# Patient Record
Sex: Female | Born: 1950 | Race: White | Hispanic: No | Smoking: Never smoker
Health system: Southern US, Community
[De-identification: ages and names within clinical notes are randomized; demographics above are authoritative.]

## PROBLEM LIST (undated history)

## (undated) DIAGNOSIS — F329 Major depressive disorder, single episode, unspecified: Secondary | ICD-10-CM

## (undated) DIAGNOSIS — F32A Depression, unspecified: Secondary | ICD-10-CM

## (undated) DIAGNOSIS — E559 Vitamin D deficiency, unspecified: Secondary | ICD-10-CM

## (undated) DIAGNOSIS — G2581 Restless legs syndrome: Secondary | ICD-10-CM

## (undated) DIAGNOSIS — F419 Anxiety disorder, unspecified: Secondary | ICD-10-CM

## (undated) DIAGNOSIS — I10 Essential (primary) hypertension: Secondary | ICD-10-CM

## (undated) DIAGNOSIS — E785 Hyperlipidemia, unspecified: Secondary | ICD-10-CM

## (undated) HISTORY — PX: CHOLECYSTECTOMY: SHX55

## (undated) HISTORY — DX: Restless legs syndrome: G25.81

## (undated) HISTORY — DX: Essential (primary) hypertension: I10

## (undated) HISTORY — DX: Major depressive disorder, single episode, unspecified: F32.9

## (undated) HISTORY — DX: Anxiety disorder, unspecified: F41.9

## (undated) HISTORY — PX: APPENDECTOMY: SHX54

## (undated) HISTORY — DX: Hyperlipidemia, unspecified: E78.5

## (undated) HISTORY — PX: ABDOMINAL HYSTERECTOMY: SHX81

## (undated) HISTORY — DX: Vitamin D deficiency, unspecified: E55.9

## (undated) HISTORY — DX: Depression, unspecified: F32.A

## (undated) HISTORY — PX: ADENOIDECTOMY: SUR15

## (undated) HISTORY — PX: ANKLE SURGERY: SHX546

---

## 1998-11-29 ENCOUNTER — Encounter: Payer: Self-pay | Admitting: Emergency Medicine

## 1998-11-29 ENCOUNTER — Emergency Department (HOSPITAL_COMMUNITY): Admission: EM | Admit: 1998-11-29 | Discharge: 1998-11-29 | Payer: Self-pay | Admitting: Emergency Medicine

## 2001-11-17 ENCOUNTER — Encounter: Admission: RE | Admit: 2001-11-17 | Discharge: 2001-11-17 | Payer: Self-pay | Admitting: Urology

## 2001-11-17 ENCOUNTER — Encounter: Payer: Self-pay | Admitting: Urology

## 2003-12-18 ENCOUNTER — Ambulatory Visit (HOSPITAL_COMMUNITY): Admission: RE | Admit: 2003-12-18 | Discharge: 2003-12-18 | Payer: Self-pay | Admitting: Gastroenterology

## 2010-01-08 ENCOUNTER — Other Ambulatory Visit: Admission: RE | Admit: 2010-01-08 | Discharge: 2010-01-08 | Payer: Self-pay | Admitting: Family Medicine

## 2010-01-17 ENCOUNTER — Other Ambulatory Visit: Admission: RE | Admit: 2010-01-17 | Discharge: 2010-01-17 | Payer: Self-pay | Admitting: Family Medicine

## 2011-01-16 ENCOUNTER — Other Ambulatory Visit: Payer: Self-pay | Admitting: Family Medicine

## 2011-01-16 ENCOUNTER — Other Ambulatory Visit (HOSPITAL_COMMUNITY)
Admission: RE | Admit: 2011-01-16 | Discharge: 2011-01-16 | Disposition: A | Discharge status: Home / Self Care | Payer: BC Managed Care – PPO | Source: Ambulatory Visit | Attending: Family Medicine | Admitting: Family Medicine

## 2011-01-16 DIAGNOSIS — Z01419 Encounter for gynecological examination (general) (routine) without abnormal findings: Secondary | ICD-10-CM | POA: Insufficient documentation

## 2011-04-18 NOTE — Op Note (Signed)
[name], Margaret Frank                         ACCOUNT NO.:  [id number]   MEDICAL RECORD NO.:  [id number]                   PATIENT TYPE:  AMB   LOCATION:                                       FACILITY:  [facility]   PHYSICIAN:  John C. Madilyn Fireman, M.D.                 DATE OF BIRTH:  [birth year]   DATE OF PROCEDURE:  12/18/2003  DATE OF DISCHARGE:                                 OPERATIVE REPORT   PROCEDURE:  Colonoscopy.   INDICATION FOR PROCEDURE:  Average-risk colon cancer screening in a 60-year-  old patient with no prior screening.   DESCRIPTION OF PROCEDURE:  The patient was placed in the left lateral  decubitus position and placed on the pulse monitor with continuous low-flow  oxygen and delivered by nasal cannula.  She was sedated with 100 mcg IV  fentanyl and 10 mg IV Versed.  The Olympus video colonoscope was inserted  into the rectum and advanced its entire length.  However, there was a lot of  looping which I could not straighten and despite multiple position changes,  abdominal pressure, and torquing maneuvers, I was unable to reach the cecum.  It was not clear what the point of the most proximal advancement was, but it  was felt to be probably somewhere in the transverse colon.  The prep was  fairly good in the areas examined.  The mucosa of the visualized transverse  as well as descending, sigmoid, and rectum appeared normal with no masses,  polyps, diverticula, or other mucosal abnormalities.  The scope was then  withdrawn and the patient returned to the recovery room in stable condition.  She tolerated the procedure well, and there were no immediate complications.   IMPRESSION:  Normal incomplete colonoscopy estimated to the proximal  transverse colon.   PLAN:  Will follow up with barium enema.                                               John C. Madilyn Fireman, M.D.    JCH/MEDQ  D:  12/18/2003  T:  12/18/2003  Job:  [id number]   cc:   Vikki Ports, M.D.  [address] [place]  [place]  [place] [address]  Fax: [phone]

## 2015-04-04 ENCOUNTER — Other Ambulatory Visit (HOSPITAL_COMMUNITY): Payer: Self-pay | Admitting: Dermatology

## 2015-10-17 DIAGNOSIS — F329 Major depressive disorder, single episode, unspecified: Secondary | ICD-10-CM | POA: Insufficient documentation

## 2015-10-17 DIAGNOSIS — F419 Anxiety disorder, unspecified: Secondary | ICD-10-CM | POA: Insufficient documentation

## 2015-10-17 DIAGNOSIS — F32A Depression, unspecified: Secondary | ICD-10-CM | POA: Insufficient documentation

## 2015-12-11 ENCOUNTER — Ambulatory Visit: Payer: BC Managed Care – PPO | Admitting: Cardiovascular Disease

## 2015-12-31 DIAGNOSIS — E785 Hyperlipidemia, unspecified: Secondary | ICD-10-CM | POA: Insufficient documentation

## 2015-12-31 DIAGNOSIS — F411 Generalized anxiety disorder: Secondary | ICD-10-CM | POA: Insufficient documentation

## 2015-12-31 DIAGNOSIS — I10 Essential (primary) hypertension: Secondary | ICD-10-CM | POA: Insufficient documentation

## 2016-01-10 ENCOUNTER — Ambulatory Visit: Payer: BC Managed Care – PPO | Admitting: Cardiovascular Disease

## 2016-02-05 NOTE — Progress Notes (Signed)
Patient ID: Margaret Frank, female   DOB: 27-Aug-1951, 65 y.o.   MRN: [id number]     Cardiology Office Note   Date:  02/11/2016   ID:  Margaret Frank, DOB 27-Aug-1951, MRN [id number]  PCP:  Margaret HeronVictoria R Rankins, MD  Cardiologist:   Margaret HawsPeter Perris Tripathi, MD   Chief Complaint  Patient presents with  . Establish Care    no sx  . Hyperlipidemia      History of Present Illness: Margaret Frank is a 65 y.o. 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   Has tried multiple statins and zetia over the last 20 years and unable to tolerate any Even Red Yeast Rice causes muscle aches  Note from Dr Cameron Sprangankins Margaret Frank that LDL was 172  She has no vascular disease She is on estrogen.  CRF HTN on Rx In our office was having asymptomatic PVC;s  Still struggles some with depression. Has two children son lives in [place] and is divorced , teaching and raising two young Children on his own.  Daughter lives in [place] and teaches dance but has RA and an autistic child.  She is not very active but denies, chest pain, dyspnea palpitations or syncope compliant with meds No excess ETOH    Past Medical History  Diagnosis Date  . Hyperlipidemia   . Hypertension   . Depression     Past Surgical History  Procedure Laterality Date  . Ankle surgery Right   . Abdominal hysterectomy    . Appendectomy    . Cholecystectomy    . Adenoidectomy       Current Outpatient Prescriptions  Medication Sig Dispense Refill  . ascorbic acid (VITAMIN C) 500 MG/5ML syrup Take 5 mLs by mouth daily.    . Cholecalciferol (VITAMIN D) 2000 units CAPS Take 1 capsule by mouth daily.    Marland Kitchen. estrogens, conjugated, (PREMARIN) 0.3 MG tablet Take 1 tablet by mouth daily.     . hydrochlorothiazide (HYDRODIURIL) 25 MG tablet Take 25 mg by mouth daily.    . Magnesium 250 MG TABS Take 1 tablet by mouth daily.    .  Omega-3 Fatty Acids (FISH OIL PO) Take 2,000 mg by mouth daily.    Marland Kitchen. PARoxetine (PAXIL) 40 MG tablet Take 1 tablet by mouth daily.    . Potassium Chloride ER 20 MEQ TBCR Take 1 tablet by mouth daily.    . Rhodiola 300 MG CAPS Take 1 capsule by mouth daily.    . vitamin B-12 (CYANOCOBALAMIN) 1000 MCG tablet Take 1 tablet by mouth daily.    Marland Kitchen. zolpidem (AMBIEN) 10 MG tablet Take 10 mg by mouth at bedtime as needed. for sleep  0   No current facility-administered medications for this visit.    Allergies:   Atorvastatin    Social History:  The patient  reports that she has never smoked. She does not have any smokeless tobacco history on file.   Family History:  The patient's family history includes Dementia in her father; Hypertension in her father; Pancreatic cancer in her mother.    ROS:  Please see the history of present illness.   Otherwise, review of systems are positive for none.   All other systems are reviewed and negative.    PHYSICAL EXAM: VS:  BP 140/78 mmHg  Pulse 75  Ht 5\' 9"  (1.753 m)  Wt  87 kg (191 lb 12.8 oz)  BMI 28.31 kg/m2  SpO2 92% , BMI Body mass index is 28.31 kg/(m^2). Affect appropriate Healthy:  appears stated age HEENT: normal Neck supple with no adenopathy JVP normal no bruits no thyromegaly Lungs clear with no wheezing and good diaphragmatic motion Heart:  S1/S2 no murmur, no rub, gallop or click PMI normal Abdomen: benighn, BS positve, no tenderness, no AAA no bruit.  No HSM or HJR Distal pulses intact with no bruits No edema Neuro non-focal Skin warm and dry No muscular weakness    EKG:  02/11/16   NSR PVC otherwise normal  Previous Margaret Frank office 10/17/15  No PVC;s    Recent Labs: No results found for requested labs within last 365 days.    Lipid Panel No results found for: CHOL, TRIG, HDL, CHOLHDL, VLDL, LDLCALC, LDLDIRECT    Wt Readings from Last 3 Encounters:  02/11/16 87 kg (191 lb 12.8 oz)      Other studies  Reviewed: Additional studies/ records that were reviewed today include: Margaret Frank office note, labs and ECG see above .    ASSESSMENT AND PLAN:  1.  Chol:  LDL 172 no vascular disease and intolerant to multiple drugs.  Will order calcium score and carotid duplex to r/o intimal thickening and CAD.  Refer to lipid Clinic for possible non PSK9 research trial.  Would not be a candidate for PSK9 without vascular disease 2. HTN:  Well controlled.  Continue current medications and low sodium Dash type diet.   3. PVC;s asymptomatic  Follow K/Mg periodically on diuretic 4. Depression :  Continue Paxil   Current medicines are reviewed at length with the patient today.  The patient does not have concerns regarding medicines.  The following changes have been made:  no change  Labs/ tests ordered today include:  Calcium score carotid US and refer to lipid clinic   Orders Placed This Encounter  Procedures  . CT Cardiac Scoring  . EKG 12-Lead     Disposition:   FU with lipid clinic      Signed, Margaret Haws, MD  02/11/2016 5:04 PM    [facility] [address] [place], [place], [place]  [address] Phone: [phone]; Fax: [phone]

## 2016-02-08 ENCOUNTER — Encounter: Payer: Self-pay | Admitting: Cardiovascular Disease

## 2016-02-11 ENCOUNTER — Ambulatory Visit (INDEPENDENT_AMBULATORY_CARE_PROVIDER_SITE_OTHER): Payer: Medicare Other | Admitting: Cardiovascular Disease

## 2016-02-11 ENCOUNTER — Encounter: Payer: Self-pay | Admitting: Cardiovascular Disease

## 2016-02-11 VITALS — BP 140/78 | HR 75 | Ht 69.0 in | Wt 191.8 lb

## 2016-02-11 DIAGNOSIS — Z7689 Persons encountering health services in other specified circumstances: Secondary | ICD-10-CM

## 2016-02-11 DIAGNOSIS — R0989 Other specified symptoms and signs involving the circulatory and respiratory systems: Secondary | ICD-10-CM

## 2016-02-11 DIAGNOSIS — E785 Hyperlipidemia, unspecified: Secondary | ICD-10-CM | POA: Diagnosis not present

## 2016-02-11 DIAGNOSIS — Z7189 Other specified counseling: Secondary | ICD-10-CM

## 2016-02-11 NOTE — Patient Instructions (Signed)
Medication Instructions:  Your physician recommends that you continue on your current medications as directed. Please refer to the Current Medication list given to you today.  Labwork: NONE  Testing/Procedures: Your physician has requested that you have a carotid duplex. This test is an ultrasound of the carotid arteries in your neck. It looks at blood flow through these arteries that supply the brain with blood. Allow one hour for this exam. There are no restrictions or special instructions.  Cardiac CT calcium score, (CAT scanning), is a noninvasive, special x-ray that produces cross-sectional images of the body using x-rays and a computer. CT scans help physicians diagnose and treat medical conditions. For some CT exams, a contrast material is used to enhance visibility in the area of the body being studied. CT scans provide greater clarity and reveal more details than regular x-ray exams.  Follow-Up: Your physician wants you to follow-up in: 6 months with Dr. Eden EmmsNishan. You will receive a reminder letter in the mail two months in advance. If you don't receive a letter, please call our office to schedule the follow-up appointment.  Your physician recommends that you schedule a follow-up appointment with lipid clinic for intolerance to statin medications.    If you need a refill on your cardiac medications before your next appointment, please call your pharmacy.

## 2016-02-18 ENCOUNTER — Other Ambulatory Visit: Payer: Medicare Other

## 2016-02-22 ENCOUNTER — Ambulatory Visit (INDEPENDENT_AMBULATORY_CARE_PROVIDER_SITE_OTHER)
Admission: RE | Admit: 2016-02-22 | Discharge: 2016-02-22 | Disposition: A | Discharge status: Home / Self Care | Payer: Self-pay | Source: Ambulatory Visit | Attending: Cardiovascular Disease | Admitting: Cardiovascular Disease

## 2016-02-22 ENCOUNTER — Ambulatory Visit (INDEPENDENT_AMBULATORY_CARE_PROVIDER_SITE_OTHER): Payer: Medicare Other | Admitting: Pharmacist

## 2016-02-22 ENCOUNTER — Ambulatory Visit (HOSPITAL_COMMUNITY)
Admission: RE | Admit: 2016-02-22 | Discharge: 2016-02-22 | Disposition: A | Discharge status: Home / Self Care | Payer: Medicare Other | Source: Ambulatory Visit | Attending: Cardiovascular Disease | Admitting: Cardiovascular Disease

## 2016-02-22 DIAGNOSIS — E785 Hyperlipidemia, unspecified: Secondary | ICD-10-CM

## 2016-02-22 DIAGNOSIS — I10 Essential (primary) hypertension: Secondary | ICD-10-CM | POA: Diagnosis not present

## 2016-02-22 DIAGNOSIS — F329 Major depressive disorder, single episode, unspecified: Secondary | ICD-10-CM | POA: Diagnosis not present

## 2016-02-22 DIAGNOSIS — I6523 Occlusion and stenosis of bilateral carotid arteries: Secondary | ICD-10-CM | POA: Insufficient documentation

## 2016-02-22 DIAGNOSIS — R0989 Other specified symptoms and signs involving the circulatory and respiratory systems: Secondary | ICD-10-CM | POA: Insufficient documentation

## 2016-02-24 NOTE — Progress Notes (Signed)
Patient ID: Margaret Frank, female   DOB: 1951-05-08, 65 y.o.   MRN: [id number]      Date:  02/22/2016  ID:  Margaret HoraMarsha Mcgruder, DOB 1951-05-08, MRN [id number]  PCP:  Clayborn HeronVictoria R Rankins, MD  Cardiologist:   Charlton HawsPeter Nishan, MD  Chief Complaint  Patient presents with  . Hyperlipidemia      History of Present Illness: Margaret HoraMarsha Mccardle is a 65 y.o. female patient of Dr. Eden EmmsNishan who was referred to the Lipid Clinic for statin intolerance.  She was referred by her PCP who has tried all of the medications with her.  Pt states she has tried 6 different statins, including low dose Crestor at 2.5mg  daily and experienced severe myalgias with each one.  She most recently tried Zetia and stated the myalgias were very severe this this as well. The OV Note from Dr Cameron Sprangankins [place] that LDL was 172.  We do not have a copy of the full panel but the patients reports her TG were better this time and her HDL was ~90 mg/dL.    Pt's PMH is significant only for HTN.  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  She also had a cardiac CT that showed her calcium score was 19 (57% percentile).     RF: HTN, age LDL goal < 130 Intolerances- Lipitor, Crestor, Zocor, Zetia  Current Therapy: none   Past Medical History  Diagnosis Date  . Hyperlipidemia   . Hypertension   . Depression     Past Surgical History  Procedure Laterality Date  . Ankle surgery Right   . Abdominal hysterectomy    . Appendectomy    . Cholecystectomy    . Adenoidectomy       Current Outpatient Prescriptions  Medication Sig Dispense Refill  . ascorbic acid (VITAMIN C) 500 MG/5ML syrup Take 5 mLs by mouth daily.    . Cholecalciferol (VITAMIN D) 2000 units CAPS Take 1 capsule by mouth daily.    Marland Kitchen. estrogens, conjugated, (PREMARIN) 0.3 MG tablet Take 1 tablet by mouth daily.     . hydrochlorothiazide (HYDRODIURIL) 25 MG tablet Take 25 mg by mouth daily.    . Magnesium 250 MG TABS Take 1  tablet by mouth daily.    . Omega-3 Fatty Acids (FISH OIL PO) Take 2,000 mg by mouth daily.    Marland Kitchen. PARoxetine (PAXIL) 40 MG tablet Take 1 tablet by mouth daily.    . Potassium Chloride ER 20 MEQ TBCR Take 1 tablet by mouth daily.    . Rhodiola 300 MG CAPS Take 1 capsule by mouth daily.    . vitamin B-12 (CYANOCOBALAMIN) 1000 MCG tablet Take 1 tablet by mouth daily.    Marland Kitchen. zolpidem (AMBIEN) 10 MG tablet Take 10 mg by mouth at bedtime as needed. for sleep  0   No current facility-administered medications for this visit.    Allergies:   Atorvastatin    Social History:  The patient  reports that she has never smoked. She does not have any smokeless tobacco history on file.   Family History:  The patient's family history includes Dementia in her father; Hypertension in her father; Pancreatic cancer in her mother.    ASSESSMENT AND PLAN:  1.  Hyperlipidemia- Pt's LDL above goal but pt is intolerant to 6 different statins.  Her diagnostic tests today showed mild disease but not enough to classify as CAD to qualify pt for PCSK-9 inhibitor therapy.  Discussed  other oral options with patient and data in primary prevention.  She was not interested in these at the moment.  We discussed potential clinical trials that will be starting soon.  She is interested in pursing these.  Will see if she meets inclusion criteria and will contact patient as soon as enrollment begins.     Edrick Oh [facility]  02/24/2016 2:06 PM    [facility] [address] [place], [place], [place]  [address] Phone: [phone]; Fax: [phone]

## 2016-06-16 ENCOUNTER — Encounter: Payer: Self-pay | Admitting: Cardiovascular Disease

## 2016-08-19 ENCOUNTER — Encounter: Payer: Self-pay | Admitting: Cardiovascular Disease

## 2016-08-29 ENCOUNTER — Ambulatory Visit: Payer: Medicare Other | Admitting: Cardiovascular Disease

## 2016-09-01 ENCOUNTER — Ambulatory Visit: Payer: Medicare Other | Admitting: Cardiovascular Disease

## 2016-12-04 IMAGING — CT CT HEART SCORING
2 series · 16 of 20 positions shown, 18 images · non-contrast
Comparison: none

ADDENDUM:
OVER-READ INTERPRETATION  CT CHEST

The following report is an over-read performed by radiologist Dr.
over-read does not include interpretation of cardiac or coronary
anatomy or pathology. The CTA interpretation by the cardiologist is
attached.
No mediastinal or hilar mass is identified in the visualized
portions. Mild fusiform enlargement of the ascending aorta with
maximum measurement 3.6 cm. The esophagus is grossly normal. Suspect
small hiatal hernia.
The visualized lungs are clear. No worrisome pulmonary lesions or
acute pulmonary findings. No interstitial lung disease or
bronchiectasis.
CLINICAL DATA: Risk stratification
EXAM:
Coronary Calcium Score
MEDICATIONS:
None.
TECHNIQUE: The patient was scanned on a Siemens Somatom 64 slice scanner. Axial
non-contrast 3 mm slices were carried out through the heart. The
data set was analyzed on a dedicated work station and scored using
the Agatson method.

[Series 2: casc 3.0 i36f 2 bestdiast 75 % · axial · 0.32mm/px · z∈[-134,-50]mm · 8 of 37 slices shown, 10 images]
[im 5/37  vessel]
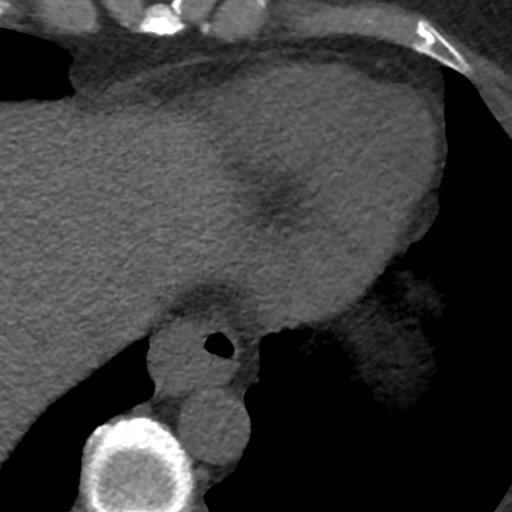
[im 5/37  lung]
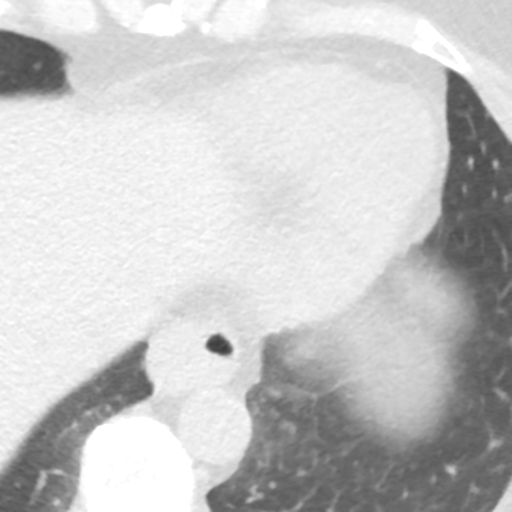
[im 9/37  vessel]
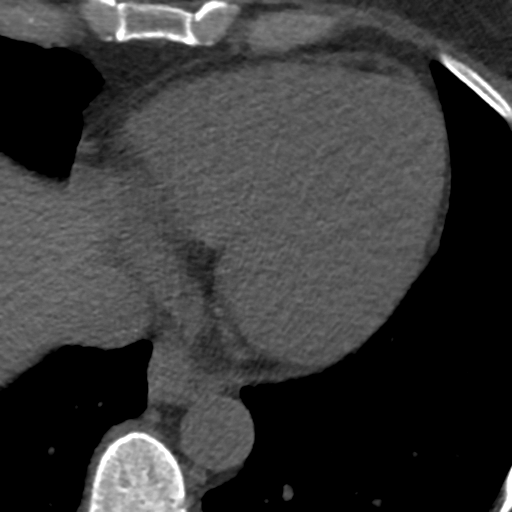
[im 13/37  vessel]
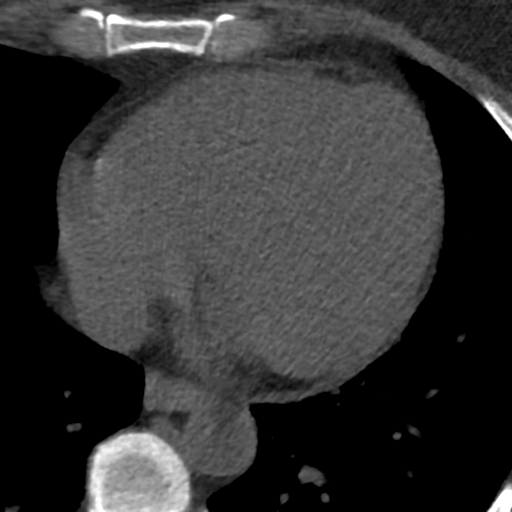
[im 17/37  vessel]
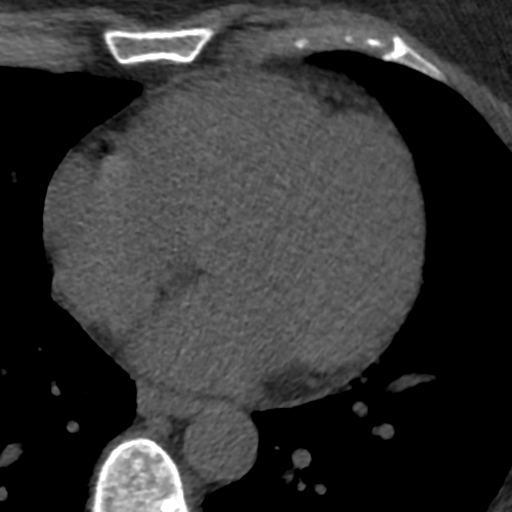
[im 21/37  vessel]
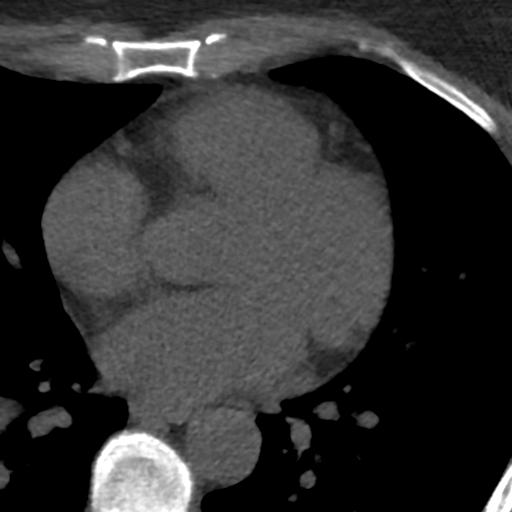
[im 21/37  lung]
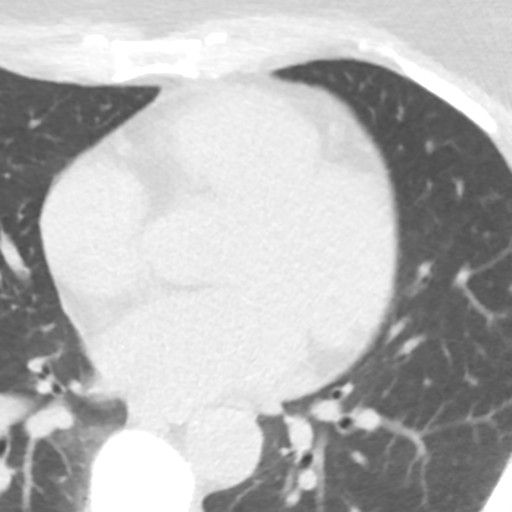
[im 25/37  vessel]
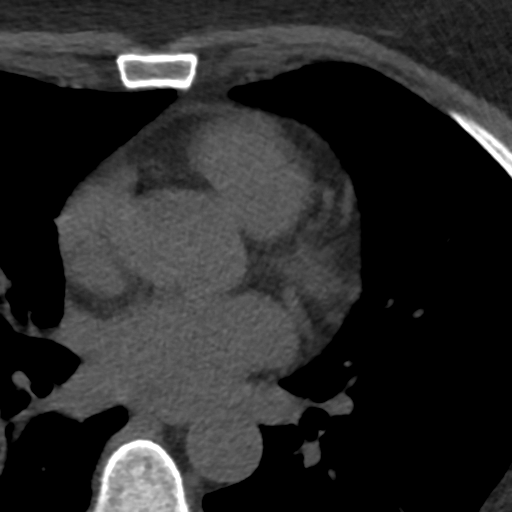
[im 29/37  vessel]
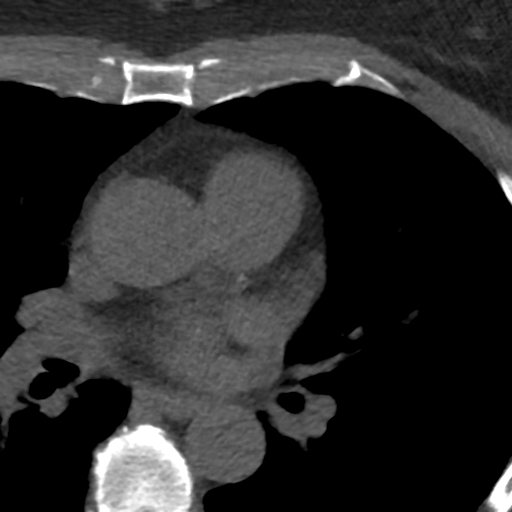
[im 33/37  vessel]
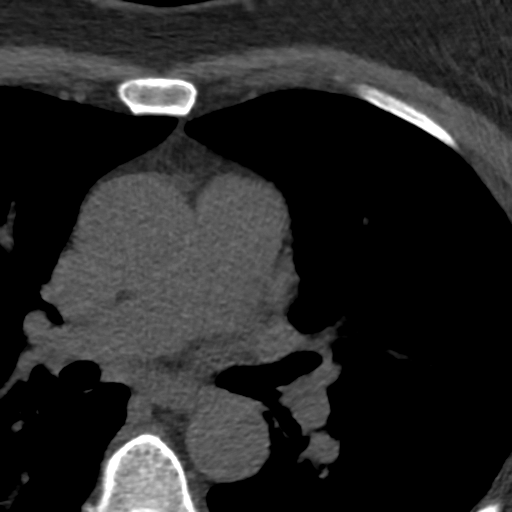

[Series 4: lung st 75 % · axial · 0.62mm/px · z∈[-134,-50]mm · 8 of 37 slices shown]
[im 5/37  lung]
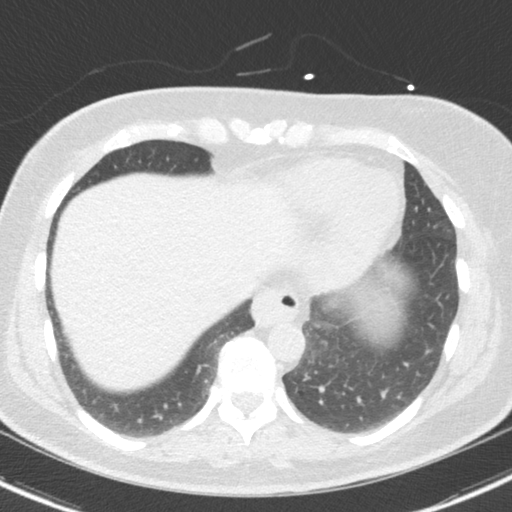
[im 9/37  lung]
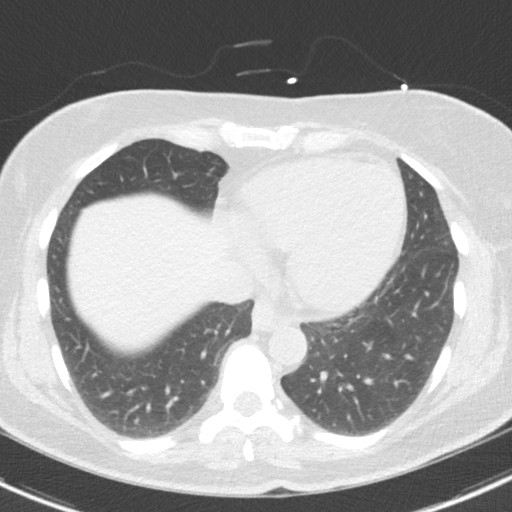
[im 13/37  lung]
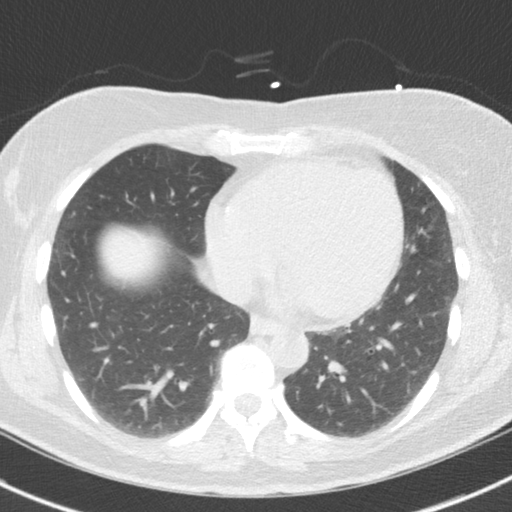
[im 17/37  lung]
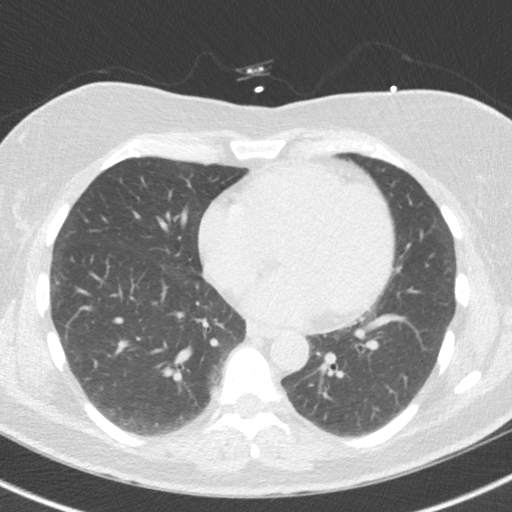
[im 21/37  lung]
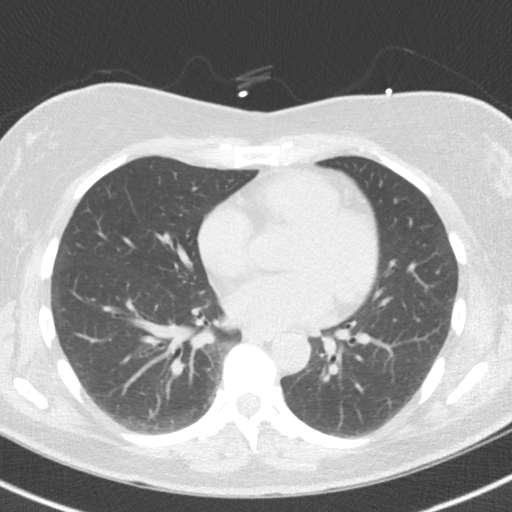
[im 25/37  lung]
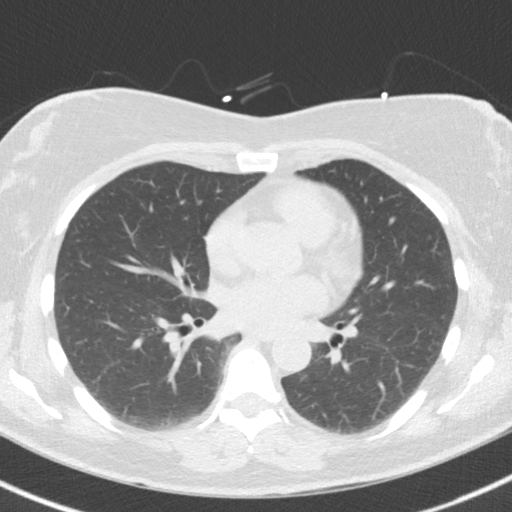
[im 29/37  lung]
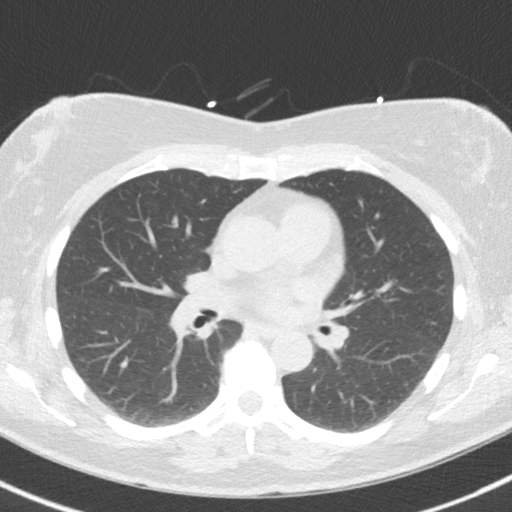
[im 33/37  lung]
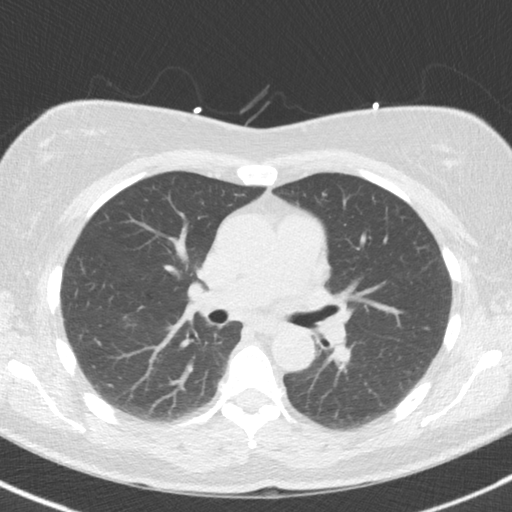

[16 of 20 positions shown; findings below may reference images not displayed]

FINDINGS: Non-cardiac: See separate report from [REDACTED].

Ascending Aorta:  Normal size.

Pericardium: Normal.

Coronary arteries:  Normal origin.
IMPRESSION: Coronary calcium score of 19. This was 57 percentile for age and sex
matched control.

Washifa Hikaka

## 2018-07-13 ENCOUNTER — Telehealth: Payer: Self-pay

## 2018-07-13 NOTE — Telephone Encounter (Signed)
Sent referral to scheduling. Notes on file.

## 2018-09-10 ENCOUNTER — Ambulatory Visit: Payer: Medicare Other

## 2018-12-23 ENCOUNTER — Encounter: Payer: Self-pay | Admitting: Neurology

## 2018-12-23 ENCOUNTER — Ambulatory Visit: Payer: Medicare Other | Admitting: Neurology

## 2018-12-23 VITALS — BP 130/84 | HR 90 | Ht 69.0 in | Wt 190.0 lb

## 2018-12-23 DIAGNOSIS — G2119 Other drug induced secondary parkinsonism: Secondary | ICD-10-CM | POA: Diagnosis not present

## 2018-12-23 NOTE — Patient Instructions (Signed)
You have a mild degree of parkinsonism, mild changes in your gait and hand tremors. I think you may have developed parkinsonian features from taking the Abilify. Please talk to your psychiatrist or nurse practitioner about potentially tapering off of the Abilify. We should re-evaluate your exam in about 4 months.   Your neurological exam is not telltale for essential or familial hand tremors and not classic for "real" or true Parkinson's disease.  Please remember to stay well-hydrated with water and well rested. Her to avoid excessive napping during the day, try to exercise on a regular basis.
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
## 2019-04-26 ENCOUNTER — Encounter: Payer: Self-pay | Admitting: Neurology

## 2019-04-26 ENCOUNTER — Other Ambulatory Visit: Payer: Self-pay

## 2019-04-26 ENCOUNTER — Ambulatory Visit: Payer: Medicare Other | Admitting: Neurology

## 2019-04-26 VITALS — BP 121/75 | HR 87 | Temp 97.7°F | Ht 68.5 in | Wt 191.0 lb

## 2019-04-26 DIAGNOSIS — R251 Tremor, unspecified: Secondary | ICD-10-CM

## 2019-04-26 DIAGNOSIS — G2119 Other drug induced secondary parkinsonism: Secondary | ICD-10-CM | POA: Diagnosis not present
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
## 2019-04-26 NOTE — Patient Instructions (Signed)
Your tremor and overall exam indeed look better.  This is reassuring.  I think we can continue to observe and follow you in 6 months from now.  Please remember, that any kind of tremor may be exacerbated by anxiety, anger, nervousness, excitement, dehydration, sleep deprivation, by caffeine, and low blood sugar values or blood sugar fluctuations. Some medications, especially some antidepressants and lithium can cause or exacerbate tremors.

## 2019-06-21 ENCOUNTER — Emergency Department (HOSPITAL_BASED_OUTPATIENT_CLINIC_OR_DEPARTMENT_OTHER): Payer: Medicare Other

## 2019-06-21 ENCOUNTER — Emergency Department (HOSPITAL_BASED_OUTPATIENT_CLINIC_OR_DEPARTMENT_OTHER)
Admission: EM | Admit: 2019-06-21 | Discharge: 2019-06-21 | Disposition: A | Discharge status: Home / Self Care | Payer: Medicare Other | Attending: Emergency Medicine | Admitting: Emergency Medicine

## 2019-06-21 ENCOUNTER — Encounter (HOSPITAL_BASED_OUTPATIENT_CLINIC_OR_DEPARTMENT_OTHER): Payer: Self-pay | Admitting: Emergency Medicine

## 2019-06-21 ENCOUNTER — Other Ambulatory Visit: Payer: Self-pay

## 2019-06-21 DIAGNOSIS — I10 Essential (primary) hypertension: Secondary | ICD-10-CM | POA: Insufficient documentation

## 2019-06-21 DIAGNOSIS — M79605 Pain in left leg: Secondary | ICD-10-CM | POA: Insufficient documentation

## 2019-06-21 DIAGNOSIS — Z9049 Acquired absence of other specified parts of digestive tract: Secondary | ICD-10-CM | POA: Diagnosis not present

## 2019-06-21 DIAGNOSIS — Z79899 Other long term (current) drug therapy: Secondary | ICD-10-CM | POA: Insufficient documentation

## 2019-06-21 MED ORDER — TIZANIDINE HCL 2 MG PO TABS: 2.0000 mg | ORAL_TABLET | Freq: Every evening | ORAL | 0 refills | Status: AC | PRN

## 2019-06-21 NOTE — ED Provider Notes (Signed)
[facility] EMERGENCY DEPARTMENT Provider Note   CSN: [id number] Arrival date & time: 06/21/19  1800    History   Chief Complaint Chief Complaint  Patient presents with  . Leg Pain    HPI Margaret Frank is a 68 y.o. female presenting for evaluation of L leg pain.   Pt states for the past 3 days she has been having left leg pain and swelling.  It began when she was walking, she had acute onset calf pain that felt like a charley horse.  Pain improves slightly throughout the day.  When she woke up the next morning, she is been having persistent pain and swelling of her left calf.  She denies numbness or tingling.  She denies fall, trauma, or injury.  [place] about [place] 3-1/2-hour drive and she did stop each way.  She denies pain or swelling on the right side.  She denies recent trauma, surgeries, immobilization, history of cancer, history of previous DVT/PE, or hormone use.  She has not been taking anything for pain as pain has been manageable.  She denies chest pain or shortness of breath.    HPI  Past Medical History:  Diagnosis Date  . Depression   . Hyperlipidemia   . Hypertension     Patient Active Problem List   Diagnosis Date Noted  . Anxiety state 12/31/2015  . Dyslipidemia 12/31/2015  . Essential (primary) hypertension 12/31/2015  . Anxiety and depression 10/17/2015    Past Surgical History:  Procedure Laterality Date  . ABDOMINAL HYSTERECTOMY    . ADENOIDECTOMY    . ANKLE SURGERY Right   . APPENDECTOMY    . CHOLECYSTECTOMY       OB History   No obstetric history on file.      Home Medications    Prior to Admission medications   Medication Sig Start Date End Date Taking? Authorizing Provider  buPROPion (WELLBUTRIN XL) 150 MG 24 hr tablet Take 450 mg by mouth every morning. 03/07/19   [provider]  estradiol (ESTRACE) 1 MG tablet Take 1 mg by mouth daily.    [provider]   hydrochlorothiazide (HYDRODIURIL) 25 MG tablet Take 25 mg by mouth daily. 04/13/15   [provider]  PARoxetine (PAXIL) 40 MG tablet Take 1 tablet by mouth daily. 06/14/15   [provider]  Potassium Chloride ER 20 MEQ TBCR Take 1 tablet by mouth daily. 05/15/15   [provider]  Rhodiola 300 MG CAPS Take 1 capsule by mouth daily.    [provider]  tiZANidine (ZANAFLEX) 2 MG tablet Take 1 tablet (2 mg total) by mouth at bedtime as needed for muscle spasms. 06/21/19   Sohana Austell, PA-C    Family History Family History  Problem Relation Age of Onset  . Pancreatic cancer Mother   . Hypertension Father   . Dementia Father     Social History Social History   Tobacco Use  . Smoking status: Never Smoker  . Smokeless tobacco: Never Used  Substance Use Topics  . Alcohol use: No    Alcohol/week: 0.0 standard drinks  . Drug use: No     Allergies   Ambien [zolpidem tartrate] and Atorvastatin   Review of Systems Review of Systems  Cardiovascular: Positive for leg swelling (L leg).  All other systems reviewed and are negative.    Physical Exam Updated Vital Signs BP 137/74 (BP Location: Left Arm)   Pulse 82  Temp 98.3 F (36.8 C) (Oral)   Resp 18   Ht 5' 8.5" (1.74 m)   Wt 87.1 kg   SpO2 98%   BMI 28.77 kg/m   Physical Exam Vitals signs and nursing note reviewed.  Constitutional:      General: She is not in acute distress.    Appearance: She is well-developed.     Comments: Resting comfortably in bed in no acute distress  HENT:     Head: Normocephalic and atraumatic.  Eyes:     Extraocular Movements: Extraocular movements intact.     Conjunctiva/sclera: Conjunctivae normal.     Pupils: Pupils are equal, round, and reactive to light.  Neck:     Musculoskeletal: Normal range of motion and neck supple.  Cardiovascular:     Rate and Rhythm: Normal rate and regular rhythm.     Pulses: Normal pulses.  Pulmonary:      Effort: Pulmonary effort is normal. No respiratory distress.     Breath sounds: Normal breath sounds. No wheezing.  Abdominal:     General: There is no distension.     Palpations: Abdomen is soft. There is no mass.     Tenderness: There is no abdominal tenderness. There is no guarding or rebound.  Musculoskeletal: Normal range of motion.     Left lower leg: Edema present.     Comments: Tenderness with swelling of the calf of the left leg.  No pitting edema.  Pedal pulses intact bilaterally.  Good distal cap refill.  Distal sensation intact.  No tenderness palpation of the knee or popliteal area.  No erythema or warmth.  Skin:    General: Skin is warm and dry.     Capillary Refill: Capillary refill takes less than 2 seconds.  Neurological:     Mental Status: She is alert and oriented to person, place, and time.      ED Treatments / Results  Labs (all labs ordered are listed, but only abnormal results are displayed) Labs Reviewed - No data to display  EKG None  Radiology US Venous Img Lower Unilateral Left  Result Date: 06/21/2019 CLINICAL DATA:  Lower extremity swelling for 3 days. Pain and swelling to. EXAM: LEFT LOWER EXTREMITY VENOUS DOPPLER ULTRASOUND TECHNIQUE: Gray-scale sonography with graded compression, as well as color Doppler and duplex ultrasound were performed to evaluate the lower extremity deep venous systems from the level of the common femoral vein and including the common femoral, femoral, profunda femoral, popliteal and calf veins including the posterior tibial, peroneal and gastrocnemius veins when visible. The superficial great saphenous vein was also interrogated. Spectral Doppler was utilized to evaluate flow at rest and with distal augmentation maneuvers in the common femoral, femoral and popliteal veins. COMPARISON:  None. FINDINGS: Contralateral Common Femoral Vein: Respiratory phasicity is normal and symmetric with the symptomatic side. No evidence of thrombus.  Normal compressibility. Common Femoral Vein: No evidence of thrombus. Normal compressibility, respiratory phasicity and response to augmentation. Saphenofemoral Junction: No evidence of thrombus. Normal compressibility and flow on color Doppler imaging. Profunda Femoral Vein: No evidence of thrombus. Normal compressibility and flow on color Doppler imaging. Femoral Vein: No evidence of thrombus. Normal compressibility, respiratory phasicity and response to augmentation. Popliteal Vein: No evidence of thrombus. Normal compressibility, respiratory phasicity and response to augmentation. Calf Veins: No evidence of thrombus. Normal compressibility and flow on color Doppler imaging. Superficial Great Saphenous Vein: No evidence of thrombus. Normal compressibility. Venous Reflux:  None. Other Findings:  Subcutaneous edema noted  about the ankle. IMPRESSION: No evidence of left lower extremity deep venous thrombosis. Electronically Signed   By: Narda RutherfordMelanie  Sanford M.D.   On: 06/21/2019 19:50    Procedures Procedures (including critical care time)  Medications Ordered in ED Medications - No data to display   Initial Impression / Assessment and Plan / ED Course  I have reviewed the triage vital signs and the nursing notes.  Pertinent labs & imaging results that were available during my care of the patient were reviewed by me and considered in my medical decision making (see chart for details).        Patient presenting for evaluation of left leg pain and swelling.  Physical exam shows leg which is more swelling on the right.  She is neurovascularly intact.  Concern for possible DVT, will obtain ultrasound for further evaluation.  Ultrasound negative.  As pain began while walking and initially felt like a cramp, consider possible muscle strain/partial tear of the gastroc muscle.  Will treat symptomatically with muscle relaxers, Tylenol, ibuprofen, rest, ice, and elevation.  Encourage follow-up with  orthopedics.  At this time, patient appears safe for discharge.  Return precautions given.  Patient states she understands and agrees to plan.  Final Clinical Impressions(s) / ED Diagnoses   Final diagnoses:  Left leg pain    ED Discharge Orders         Ordered    tiZANidine (ZANAFLEX) 2 MG tablet  At bedtime PRN     06/21/19 2020           Alveria ApleyCaccavale, Kiara Keep, PA-C 06/21/19 2238    Maia PlanLong, Joshua G, MD 06/22/19 [phone]

## 2019-06-21 NOTE — Discharge Instructions (Addendum)
Take ibuprofen 3 times a day with meals.  Do not take other anti-inflammatories at the same time open (Advil, Motrin, naproxen, Aleve). You may supplement with Tylenol if you need further pain control. Use ice packs for pain and swelling.  Use muscle relaxer as needed for muscle pain.  Have caution, this may make you tired or groggy.  Do not drive or operate heavy machinery while taking this medication.  Keep your foot elevated when able. Follow-up with your orthopedic doctor in 1 week if your symptoms not improving. Return to the emergency room if develop severe worsening pain, numbness, color change of your foot, or any new, worsening, or concerning symptoms.

## 2019-06-21 NOTE — ED Triage Notes (Signed)
Left calf pain and swelling since yesterday, denies SOB

## 2019-10-31 ENCOUNTER — Ambulatory Visit: Payer: Medicare Other | Admitting: Neurology

## 2019-10-31 ENCOUNTER — Other Ambulatory Visit: Payer: Self-pay

## 2019-10-31 ENCOUNTER — Encounter: Payer: Self-pay | Admitting: Neurology

## 2019-10-31 VITALS — BP 126/70 | HR 89 | Temp 97.9°F | Ht 68.0 in | Wt 190.4 lb

## 2019-10-31 DIAGNOSIS — R251 Tremor, unspecified: Secondary | ICD-10-CM

## 2019-10-31 NOTE — Patient Instructions (Signed)
Your exam is improved, I am happy to see that.  Your posture and gait are better.  You do have a mild hand tremor, but it is certainly better compared to when we first met.  At this point, I would be happy to see you back on an as-needed basis.  Please try to hydrate better with water and limit your caffeine/tea intake to about 2 servings per day if possible.
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
## 2019-11-17 ENCOUNTER — Other Ambulatory Visit: Payer: Self-pay

## 2019-11-17 MED ORDER — BUPROPION HCL ER (XL) 150 MG PO TB24: 450.0000 mg | ORAL_TABLET | Freq: Every morning | ORAL | 0 refills | Status: AC

## 2020-02-07 ENCOUNTER — Other Ambulatory Visit: Payer: Self-pay | Admitting: Adult Health

## 2020-02-07 NOTE — Telephone Encounter (Signed)
Does she need an apt? Not sure when she was last seen?

## 2020-04-02 IMAGING — US VENOUS DOPPLER ULTRASOUND OF LEFT LOWER EXTREMITY
1 series · 13 of 24 positions shown · non-contrast
Comparison: None.

CLINICAL DATA: Lower extremity swelling for 3 days. Pain and
swelling to.



[Series 1: venous doppler ultrasound of left lower extremity · 13 of 29 slices shown]
[im 1/29]
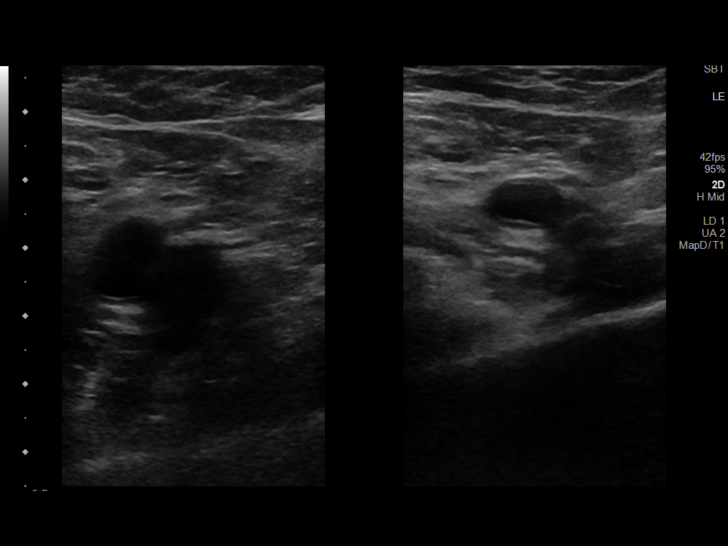
[im 3/29]
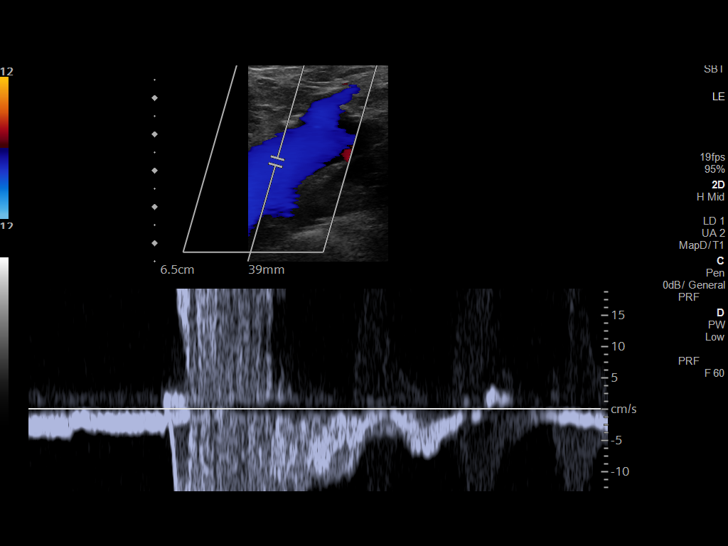
[im 5/29]
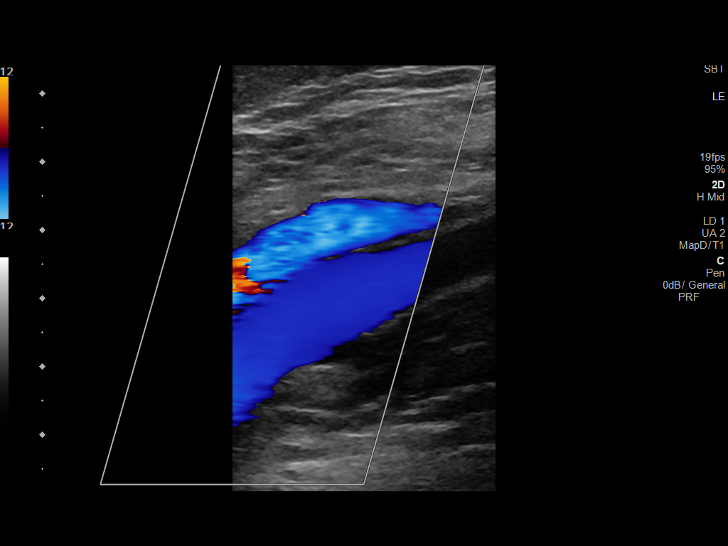
[im 8/29]
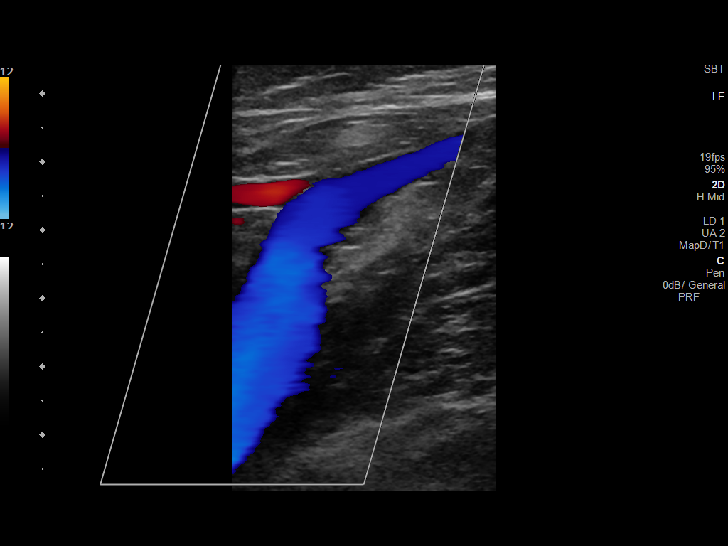
[im 10/29]
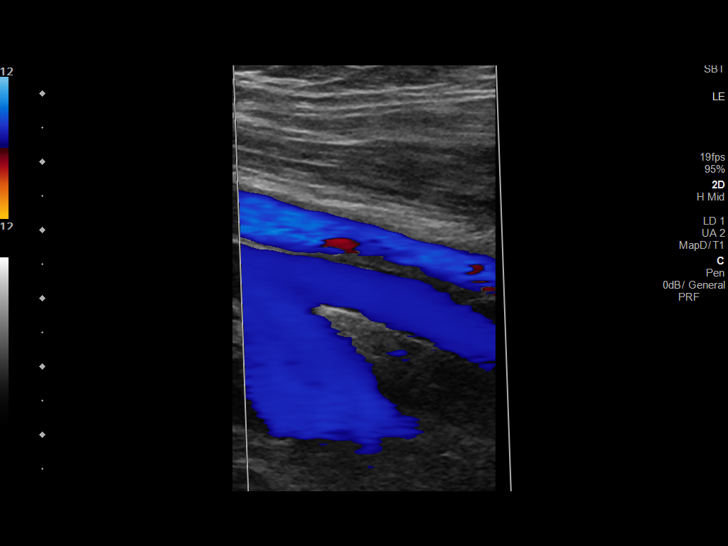
[im 13/29]
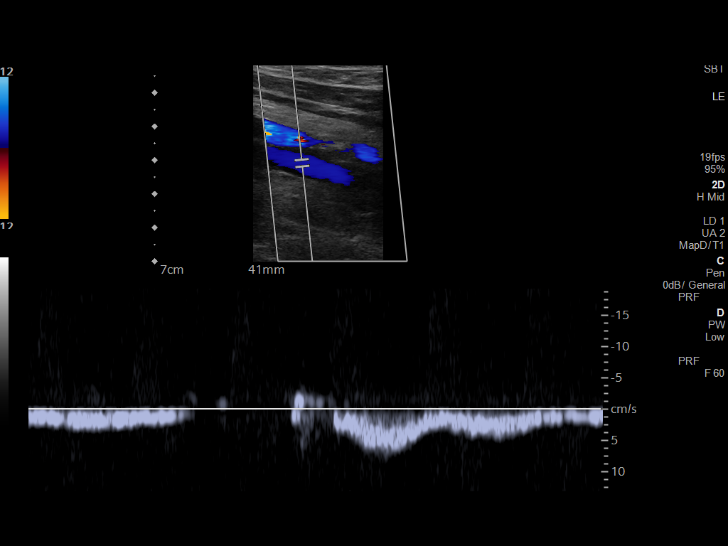
[im 15/29]
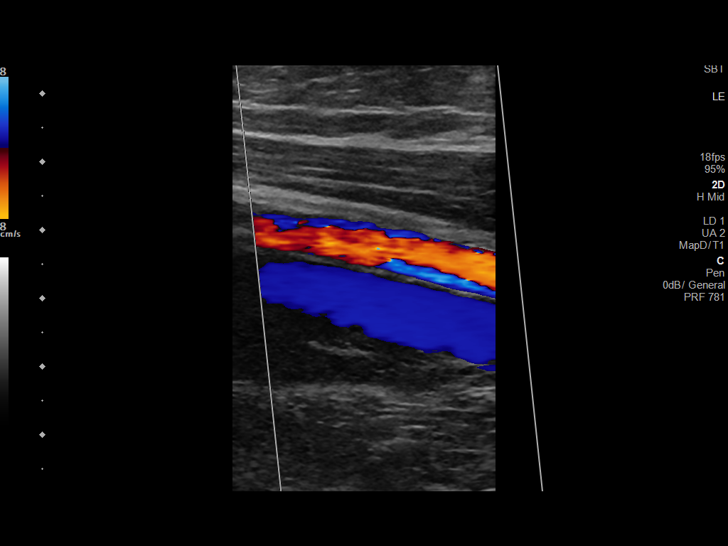
[im 16/29]
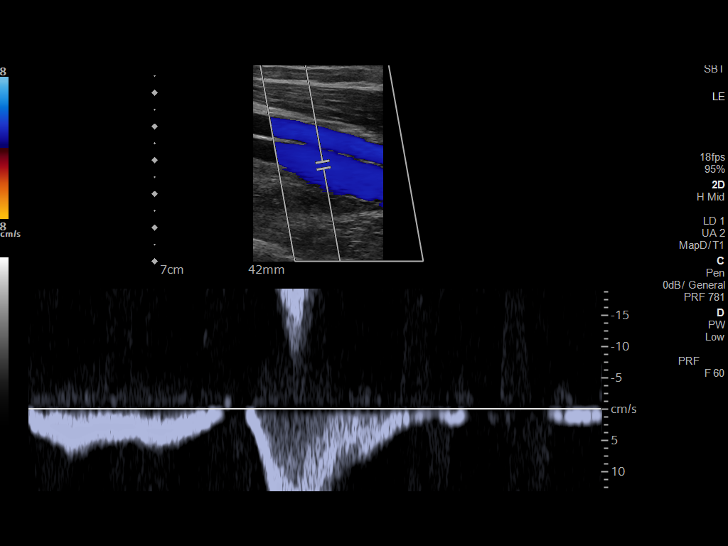
[im 19/29]
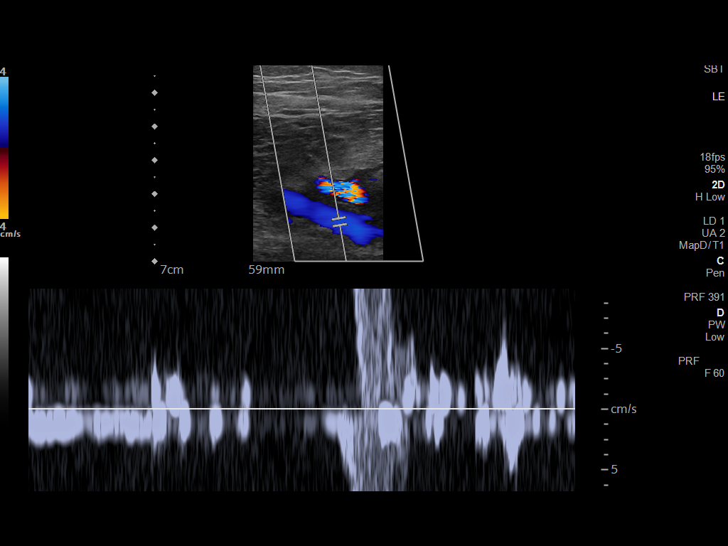
[im 21/29]
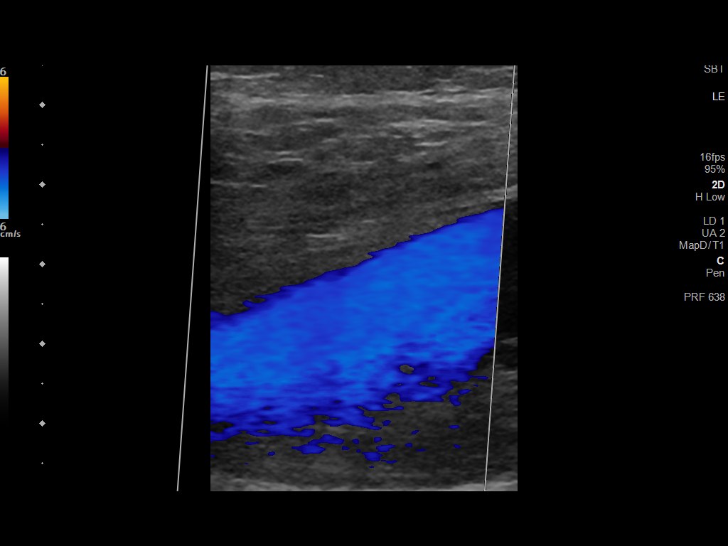
[im 24/29]
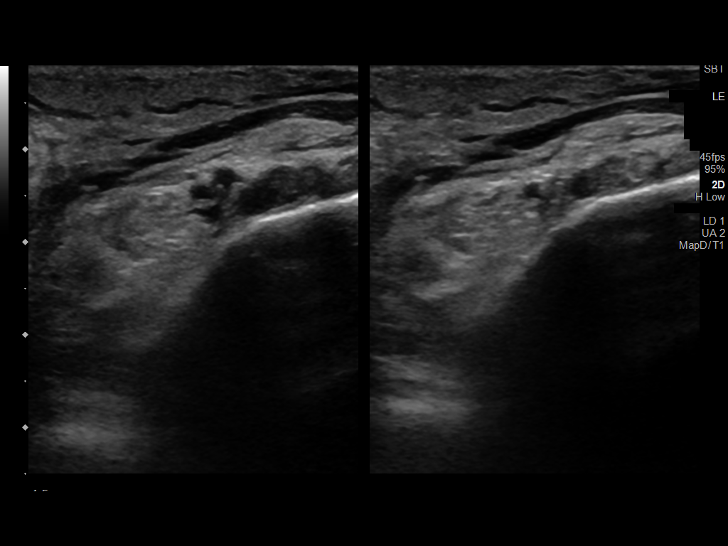
[im 26/29]
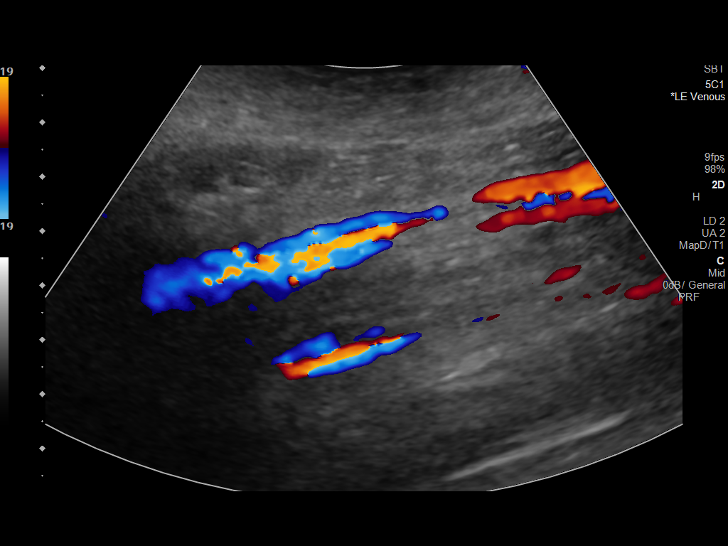
[im 29/29]
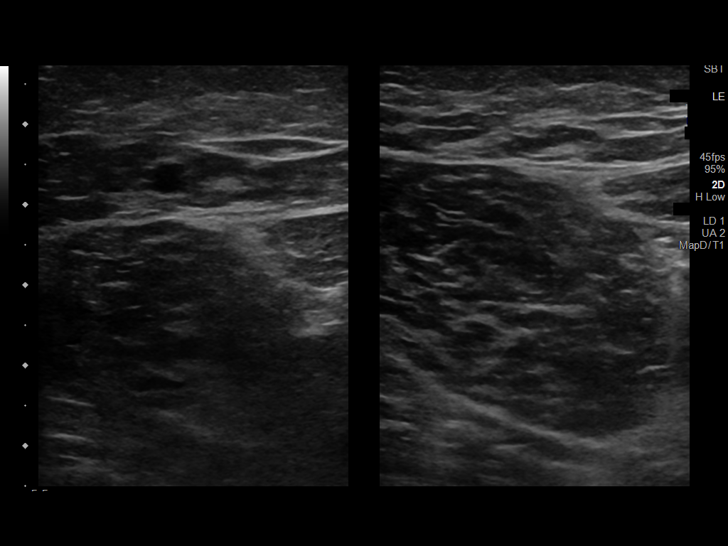

[13 of 24 positions shown; findings below may reference images not displayed]

FINDINGS: Contralateral Common Femoral Vein: Respiratory phasicity is normal
and symmetric with the symptomatic side. No evidence of thrombus.
Normal compressibility.

Common Femoral Vein: No evidence of thrombus. Normal
compressibility, respiratory phasicity and response to augmentation.

Saphenofemoral Junction: No evidence of thrombus. Normal
compressibility and flow on color Doppler imaging.

Profunda Femoral Vein: No evidence of thrombus. Normal
compressibility and flow on color Doppler imaging.

Femoral Vein: No evidence of thrombus. Normal compressibility,
respiratory phasicity and response to augmentation.

Popliteal Vein: No evidence of thrombus. Normal compressibility,
respiratory phasicity and response to augmentation.

Calf Veins: No evidence of thrombus. Normal compressibility and flow
on color Doppler imaging.

Superficial Great Saphenous Vein: No evidence of thrombus. Normal
compressibility.

Venous Reflux:  None.

Other Findings:  Subcutaneous edema noted about the ankle.
IMPRESSION: No evidence of left lower extremity deep venous thrombosis.

## 2020-04-08 DIAGNOSIS — S61211A Laceration without foreign body of left index finger without damage to nail, initial encounter: Secondary | ICD-10-CM | POA: Diagnosis not present

## 2020-04-20 DIAGNOSIS — R600 Localized edema: Secondary | ICD-10-CM | POA: Diagnosis not present

## 2020-04-20 DIAGNOSIS — G2581 Restless legs syndrome: Secondary | ICD-10-CM | POA: Diagnosis not present

## 2020-04-20 DIAGNOSIS — F419 Anxiety disorder, unspecified: Secondary | ICD-10-CM | POA: Diagnosis not present

## 2020-04-20 DIAGNOSIS — Z79899 Other long term (current) drug therapy: Secondary | ICD-10-CM | POA: Diagnosis not present

## 2020-04-20 DIAGNOSIS — Z9181 History of falling: Secondary | ICD-10-CM | POA: Diagnosis not present

## 2020-04-20 DIAGNOSIS — F329 Major depressive disorder, single episode, unspecified: Secondary | ICD-10-CM | POA: Diagnosis not present

## 2020-04-20 DIAGNOSIS — E559 Vitamin D deficiency, unspecified: Secondary | ICD-10-CM | POA: Diagnosis not present

## 2020-04-20 DIAGNOSIS — E7800 Pure hypercholesterolemia, unspecified: Secondary | ICD-10-CM | POA: Diagnosis not present

## 2020-06-12 DIAGNOSIS — H40053 Ocular hypertension, bilateral: Secondary | ICD-10-CM | POA: Diagnosis not present

## 2020-06-12 DIAGNOSIS — Z961 Presence of intraocular lens: Secondary | ICD-10-CM | POA: Diagnosis not present

## 2020-06-12 DIAGNOSIS — H2511 Age-related nuclear cataract, right eye: Secondary | ICD-10-CM | POA: Diagnosis not present

## 2020-06-12 DIAGNOSIS — H35342 Macular cyst, hole, or pseudohole, left eye: Secondary | ICD-10-CM | POA: Diagnosis not present

## 2020-06-22 DIAGNOSIS — Z1231 Encounter for screening mammogram for malignant neoplasm of breast: Secondary | ICD-10-CM | POA: Diagnosis not present

## 2020-07-16 DIAGNOSIS — D0471 Carcinoma in situ of skin of right lower limb, including hip: Secondary | ICD-10-CM | POA: Diagnosis not present

## 2020-07-16 DIAGNOSIS — L905 Scar conditions and fibrosis of skin: Secondary | ICD-10-CM | POA: Diagnosis not present

## 2020-07-16 DIAGNOSIS — C44729 Squamous cell carcinoma of skin of left lower limb, including hip: Secondary | ICD-10-CM | POA: Diagnosis not present

## 2020-07-16 DIAGNOSIS — D485 Neoplasm of uncertain behavior of skin: Secondary | ICD-10-CM | POA: Diagnosis not present

## 2020-07-16 DIAGNOSIS — L570 Actinic keratosis: Secondary | ICD-10-CM | POA: Diagnosis not present

## 2020-07-16 DIAGNOSIS — L821 Other seborrheic keratosis: Secondary | ICD-10-CM | POA: Diagnosis not present

## 2020-07-16 DIAGNOSIS — Z85828 Personal history of other malignant neoplasm of skin: Secondary | ICD-10-CM | POA: Diagnosis not present

## 2020-07-17 DIAGNOSIS — M9902 Segmental and somatic dysfunction of thoracic region: Secondary | ICD-10-CM | POA: Diagnosis not present

## 2020-07-17 DIAGNOSIS — M9905 Segmental and somatic dysfunction of pelvic region: Secondary | ICD-10-CM | POA: Diagnosis not present

## 2020-07-17 DIAGNOSIS — M9903 Segmental and somatic dysfunction of lumbar region: Secondary | ICD-10-CM | POA: Diagnosis not present

## 2020-07-17 DIAGNOSIS — M62830 Muscle spasm of back: Secondary | ICD-10-CM | POA: Diagnosis not present

## 2020-07-19 DIAGNOSIS — M9903 Segmental and somatic dysfunction of lumbar region: Secondary | ICD-10-CM | POA: Diagnosis not present

## 2020-07-19 DIAGNOSIS — M9902 Segmental and somatic dysfunction of thoracic region: Secondary | ICD-10-CM | POA: Diagnosis not present

## 2020-07-19 DIAGNOSIS — M9905 Segmental and somatic dysfunction of pelvic region: Secondary | ICD-10-CM | POA: Diagnosis not present

## 2020-07-19 DIAGNOSIS — M62830 Muscle spasm of back: Secondary | ICD-10-CM | POA: Diagnosis not present

## 2020-07-23 DIAGNOSIS — D0471 Carcinoma in situ of skin of right lower limb, including hip: Secondary | ICD-10-CM | POA: Diagnosis not present

## 2020-07-23 DIAGNOSIS — Z85828 Personal history of other malignant neoplasm of skin: Secondary | ICD-10-CM | POA: Diagnosis not present

## 2020-07-24 DIAGNOSIS — M62830 Muscle spasm of back: Secondary | ICD-10-CM | POA: Diagnosis not present

## 2020-07-24 DIAGNOSIS — M9903 Segmental and somatic dysfunction of lumbar region: Secondary | ICD-10-CM | POA: Diagnosis not present

## 2020-07-24 DIAGNOSIS — M9902 Segmental and somatic dysfunction of thoracic region: Secondary | ICD-10-CM | POA: Diagnosis not present

## 2020-07-24 DIAGNOSIS — M9905 Segmental and somatic dysfunction of pelvic region: Secondary | ICD-10-CM | POA: Diagnosis not present

## 2020-07-26 DIAGNOSIS — M9903 Segmental and somatic dysfunction of lumbar region: Secondary | ICD-10-CM | POA: Diagnosis not present

## 2020-07-26 DIAGNOSIS — M9905 Segmental and somatic dysfunction of pelvic region: Secondary | ICD-10-CM | POA: Diagnosis not present

## 2020-07-26 DIAGNOSIS — M62830 Muscle spasm of back: Secondary | ICD-10-CM | POA: Diagnosis not present

## 2020-07-26 DIAGNOSIS — M9902 Segmental and somatic dysfunction of thoracic region: Secondary | ICD-10-CM | POA: Diagnosis not present

## 2020-10-02 DIAGNOSIS — M9902 Segmental and somatic dysfunction of thoracic region: Secondary | ICD-10-CM | POA: Diagnosis not present

## 2020-10-02 DIAGNOSIS — M62830 Muscle spasm of back: Secondary | ICD-10-CM | POA: Diagnosis not present

## 2020-10-02 DIAGNOSIS — M9903 Segmental and somatic dysfunction of lumbar region: Secondary | ICD-10-CM | POA: Diagnosis not present

## 2020-10-02 DIAGNOSIS — M9905 Segmental and somatic dysfunction of pelvic region: Secondary | ICD-10-CM | POA: Diagnosis not present

## 2020-10-10 DIAGNOSIS — Z1331 Encounter for screening for depression: Secondary | ICD-10-CM | POA: Diagnosis not present

## 2020-10-10 DIAGNOSIS — Z139 Encounter for screening, unspecified: Secondary | ICD-10-CM | POA: Diagnosis not present

## 2020-10-10 DIAGNOSIS — Z9181 History of falling: Secondary | ICD-10-CM | POA: Diagnosis not present

## 2020-10-10 DIAGNOSIS — Z0000 Encounter for general adult medical examination without abnormal findings: Secondary | ICD-10-CM | POA: Diagnosis not present

## 2020-10-11 DIAGNOSIS — M9902 Segmental and somatic dysfunction of thoracic region: Secondary | ICD-10-CM | POA: Diagnosis not present

## 2020-10-11 DIAGNOSIS — M62830 Muscle spasm of back: Secondary | ICD-10-CM | POA: Diagnosis not present

## 2020-10-11 DIAGNOSIS — M9903 Segmental and somatic dysfunction of lumbar region: Secondary | ICD-10-CM | POA: Diagnosis not present

## 2020-10-11 DIAGNOSIS — M9905 Segmental and somatic dysfunction of pelvic region: Secondary | ICD-10-CM | POA: Diagnosis not present

## 2020-11-14 DIAGNOSIS — M9902 Segmental and somatic dysfunction of thoracic region: Secondary | ICD-10-CM | POA: Diagnosis not present

## 2020-11-14 DIAGNOSIS — M62830 Muscle spasm of back: Secondary | ICD-10-CM | POA: Diagnosis not present

## 2020-11-14 DIAGNOSIS — M9905 Segmental and somatic dysfunction of pelvic region: Secondary | ICD-10-CM | POA: Diagnosis not present

## 2020-11-14 DIAGNOSIS — M9903 Segmental and somatic dysfunction of lumbar region: Secondary | ICD-10-CM | POA: Diagnosis not present

## 2020-11-27 DIAGNOSIS — M9905 Segmental and somatic dysfunction of pelvic region: Secondary | ICD-10-CM | POA: Diagnosis not present

## 2020-11-27 DIAGNOSIS — M62830 Muscle spasm of back: Secondary | ICD-10-CM | POA: Diagnosis not present

## 2020-11-27 DIAGNOSIS — M9902 Segmental and somatic dysfunction of thoracic region: Secondary | ICD-10-CM | POA: Diagnosis not present

## 2020-11-27 DIAGNOSIS — M9903 Segmental and somatic dysfunction of lumbar region: Secondary | ICD-10-CM | POA: Diagnosis not present

## 2020-12-13 DIAGNOSIS — H2511 Age-related nuclear cataract, right eye: Secondary | ICD-10-CM | POA: Diagnosis not present

## 2020-12-13 DIAGNOSIS — H40053 Ocular hypertension, bilateral: Secondary | ICD-10-CM | POA: Diagnosis not present

## 2020-12-13 DIAGNOSIS — H524 Presbyopia: Secondary | ICD-10-CM | POA: Diagnosis not present

## 2020-12-13 DIAGNOSIS — Z961 Presence of intraocular lens: Secondary | ICD-10-CM | POA: Diagnosis not present

## 2020-12-13 DIAGNOSIS — H35342 Macular cyst, hole, or pseudohole, left eye: Secondary | ICD-10-CM | POA: Diagnosis not present

## 2020-12-13 DIAGNOSIS — H52221 Regular astigmatism, right eye: Secondary | ICD-10-CM | POA: Diagnosis not present

## 2021-01-07 DIAGNOSIS — G2581 Restless legs syndrome: Secondary | ICD-10-CM | POA: Diagnosis not present

## 2021-01-07 DIAGNOSIS — Z79899 Other long term (current) drug therapy: Secondary | ICD-10-CM | POA: Diagnosis not present

## 2021-01-07 DIAGNOSIS — F419 Anxiety disorder, unspecified: Secondary | ICD-10-CM | POA: Diagnosis not present

## 2021-01-07 DIAGNOSIS — Z9181 History of falling: Secondary | ICD-10-CM | POA: Diagnosis not present

## 2021-01-07 DIAGNOSIS — R600 Localized edema: Secondary | ICD-10-CM | POA: Diagnosis not present

## 2021-01-07 DIAGNOSIS — E785 Hyperlipidemia, unspecified: Secondary | ICD-10-CM | POA: Diagnosis not present

## 2021-01-07 DIAGNOSIS — E559 Vitamin D deficiency, unspecified: Secondary | ICD-10-CM | POA: Diagnosis not present

## 2021-01-07 DIAGNOSIS — F32A Depression, unspecified: Secondary | ICD-10-CM | POA: Diagnosis not present

## 2021-01-29 DIAGNOSIS — D1801 Hemangioma of skin and subcutaneous tissue: Secondary | ICD-10-CM | POA: Diagnosis not present

## 2021-01-29 DIAGNOSIS — L570 Actinic keratosis: Secondary | ICD-10-CM | POA: Diagnosis not present

## 2021-01-29 DIAGNOSIS — Z85828 Personal history of other malignant neoplasm of skin: Secondary | ICD-10-CM | POA: Diagnosis not present

## 2021-01-29 DIAGNOSIS — D2272 Melanocytic nevi of left lower limb, including hip: Secondary | ICD-10-CM | POA: Diagnosis not present

## 2021-01-29 DIAGNOSIS — L821 Other seborrheic keratosis: Secondary | ICD-10-CM | POA: Diagnosis not present

## 2021-02-04 DIAGNOSIS — M9905 Segmental and somatic dysfunction of pelvic region: Secondary | ICD-10-CM | POA: Diagnosis not present

## 2021-02-04 DIAGNOSIS — M62830 Muscle spasm of back: Secondary | ICD-10-CM | POA: Diagnosis not present

## 2021-02-04 DIAGNOSIS — M9902 Segmental and somatic dysfunction of thoracic region: Secondary | ICD-10-CM | POA: Diagnosis not present

## 2021-02-04 DIAGNOSIS — M9903 Segmental and somatic dysfunction of lumbar region: Secondary | ICD-10-CM | POA: Diagnosis not present

## 2021-02-11 DIAGNOSIS — M9902 Segmental and somatic dysfunction of thoracic region: Secondary | ICD-10-CM | POA: Diagnosis not present

## 2021-02-11 DIAGNOSIS — M9903 Segmental and somatic dysfunction of lumbar region: Secondary | ICD-10-CM | POA: Diagnosis not present

## 2021-02-11 DIAGNOSIS — M9905 Segmental and somatic dysfunction of pelvic region: Secondary | ICD-10-CM | POA: Diagnosis not present

## 2021-02-11 DIAGNOSIS — M62830 Muscle spasm of back: Secondary | ICD-10-CM | POA: Diagnosis not present

## 2021-02-25 DIAGNOSIS — M62830 Muscle spasm of back: Secondary | ICD-10-CM | POA: Diagnosis not present

## 2021-02-25 DIAGNOSIS — M9905 Segmental and somatic dysfunction of pelvic region: Secondary | ICD-10-CM | POA: Diagnosis not present

## 2021-02-25 DIAGNOSIS — M9903 Segmental and somatic dysfunction of lumbar region: Secondary | ICD-10-CM | POA: Diagnosis not present

## 2021-02-25 DIAGNOSIS — M9902 Segmental and somatic dysfunction of thoracic region: Secondary | ICD-10-CM | POA: Diagnosis not present

## 2021-07-16 DIAGNOSIS — Z1231 Encounter for screening mammogram for malignant neoplasm of breast: Secondary | ICD-10-CM | POA: Diagnosis not present

## 2021-07-19 DIAGNOSIS — H40053 Ocular hypertension, bilateral: Secondary | ICD-10-CM | POA: Diagnosis not present

## 2021-07-23 DIAGNOSIS — M9905 Segmental and somatic dysfunction of pelvic region: Secondary | ICD-10-CM | POA: Diagnosis not present

## 2021-07-23 DIAGNOSIS — M9903 Segmental and somatic dysfunction of lumbar region: Secondary | ICD-10-CM | POA: Diagnosis not present

## 2021-07-23 DIAGNOSIS — M62830 Muscle spasm of back: Secondary | ICD-10-CM | POA: Diagnosis not present

## 2021-07-23 DIAGNOSIS — M9902 Segmental and somatic dysfunction of thoracic region: Secondary | ICD-10-CM | POA: Diagnosis not present

## 2021-08-22 DIAGNOSIS — M9902 Segmental and somatic dysfunction of thoracic region: Secondary | ICD-10-CM | POA: Diagnosis not present

## 2021-08-22 DIAGNOSIS — M62830 Muscle spasm of back: Secondary | ICD-10-CM | POA: Diagnosis not present

## 2021-08-22 DIAGNOSIS — M9903 Segmental and somatic dysfunction of lumbar region: Secondary | ICD-10-CM | POA: Diagnosis not present

## 2021-08-22 DIAGNOSIS — M9905 Segmental and somatic dysfunction of pelvic region: Secondary | ICD-10-CM | POA: Diagnosis not present

## 2021-09-17 DIAGNOSIS — E785 Hyperlipidemia, unspecified: Secondary | ICD-10-CM | POA: Diagnosis not present

## 2021-09-17 DIAGNOSIS — G2581 Restless legs syndrome: Secondary | ICD-10-CM | POA: Diagnosis not present

## 2021-09-17 DIAGNOSIS — F419 Anxiety disorder, unspecified: Secondary | ICD-10-CM | POA: Diagnosis not present

## 2021-09-17 DIAGNOSIS — Z23 Encounter for immunization: Secondary | ICD-10-CM | POA: Diagnosis not present

## 2021-09-17 DIAGNOSIS — Z6829 Body mass index (BMI) 29.0-29.9, adult: Secondary | ICD-10-CM | POA: Diagnosis not present

## 2021-09-17 DIAGNOSIS — E559 Vitamin D deficiency, unspecified: Secondary | ICD-10-CM | POA: Diagnosis not present

## 2021-10-16 DIAGNOSIS — M8589 Other specified disorders of bone density and structure, multiple sites: Secondary | ICD-10-CM | POA: Diagnosis not present

## 2021-10-16 DIAGNOSIS — L680 Hirsutism: Secondary | ICD-10-CM | POA: Diagnosis not present

## 2021-10-16 DIAGNOSIS — Z139 Encounter for screening, unspecified: Secondary | ICD-10-CM | POA: Diagnosis not present

## 2021-10-16 DIAGNOSIS — Z6828 Body mass index (BMI) 28.0-28.9, adult: Secondary | ICD-10-CM | POA: Diagnosis not present

## 2021-10-23 DIAGNOSIS — Z780 Asymptomatic menopausal state: Secondary | ICD-10-CM | POA: Diagnosis not present

## 2021-10-28 DIAGNOSIS — Z6828 Body mass index (BMI) 28.0-28.9, adult: Secondary | ICD-10-CM | POA: Diagnosis not present

## 2021-10-28 DIAGNOSIS — R319 Hematuria, unspecified: Secondary | ICD-10-CM | POA: Diagnosis not present

## 2021-11-26 DIAGNOSIS — D1801 Hemangioma of skin and subcutaneous tissue: Secondary | ICD-10-CM | POA: Diagnosis not present

## 2021-11-26 DIAGNOSIS — D0472 Carcinoma in situ of skin of left lower limb, including hip: Secondary | ICD-10-CM | POA: Diagnosis not present

## 2021-11-26 DIAGNOSIS — D225 Melanocytic nevi of trunk: Secondary | ICD-10-CM | POA: Diagnosis not present

## 2021-11-26 DIAGNOSIS — L570 Actinic keratosis: Secondary | ICD-10-CM | POA: Diagnosis not present

## 2021-11-26 DIAGNOSIS — L821 Other seborrheic keratosis: Secondary | ICD-10-CM | POA: Diagnosis not present

## 2021-11-26 DIAGNOSIS — Z85828 Personal history of other malignant neoplasm of skin: Secondary | ICD-10-CM | POA: Diagnosis not present

## 2021-11-26 DIAGNOSIS — D485 Neoplasm of uncertain behavior of skin: Secondary | ICD-10-CM | POA: Diagnosis not present

## 2021-12-12 DIAGNOSIS — K449 Diaphragmatic hernia without obstruction or gangrene: Secondary | ICD-10-CM | POA: Diagnosis not present

## 2021-12-12 DIAGNOSIS — K5900 Constipation, unspecified: Secondary | ICD-10-CM | POA: Diagnosis not present

## 2021-12-12 DIAGNOSIS — I700 Atherosclerosis of aorta: Secondary | ICD-10-CM | POA: Diagnosis not present

## 2021-12-12 DIAGNOSIS — R6881 Early satiety: Secondary | ICD-10-CM | POA: Diagnosis not present

## 2021-12-12 DIAGNOSIS — R93422 Abnormal radiologic findings on diagnostic imaging of left kidney: Secondary | ICD-10-CM | POA: Diagnosis not present

## 2021-12-27 DIAGNOSIS — Z87440 Personal history of urinary (tract) infections: Secondary | ICD-10-CM | POA: Diagnosis not present

## 2022-01-07 DIAGNOSIS — H40053 Ocular hypertension, bilateral: Secondary | ICD-10-CM | POA: Diagnosis not present

## 2022-01-07 DIAGNOSIS — M65342 Trigger finger, left ring finger: Secondary | ICD-10-CM | POA: Diagnosis not present

## 2022-01-07 DIAGNOSIS — M65341 Trigger finger, right ring finger: Secondary | ICD-10-CM | POA: Diagnosis not present

## 2022-01-07 DIAGNOSIS — H35342 Macular cyst, hole, or pseudohole, left eye: Secondary | ICD-10-CM | POA: Diagnosis not present

## 2022-01-20 DIAGNOSIS — M25641 Stiffness of right hand, not elsewhere classified: Secondary | ICD-10-CM | POA: Diagnosis not present

## 2022-02-12 DIAGNOSIS — M9905 Segmental and somatic dysfunction of pelvic region: Secondary | ICD-10-CM | POA: Diagnosis not present

## 2022-02-12 DIAGNOSIS — M9902 Segmental and somatic dysfunction of thoracic region: Secondary | ICD-10-CM | POA: Diagnosis not present

## 2022-02-12 DIAGNOSIS — M62830 Muscle spasm of back: Secondary | ICD-10-CM | POA: Diagnosis not present

## 2022-02-12 DIAGNOSIS — M9903 Segmental and somatic dysfunction of lumbar region: Secondary | ICD-10-CM | POA: Diagnosis not present

## 2022-03-18 DIAGNOSIS — M9902 Segmental and somatic dysfunction of thoracic region: Secondary | ICD-10-CM | POA: Diagnosis not present

## 2022-03-18 DIAGNOSIS — M62830 Muscle spasm of back: Secondary | ICD-10-CM | POA: Diagnosis not present

## 2022-03-18 DIAGNOSIS — M9905 Segmental and somatic dysfunction of pelvic region: Secondary | ICD-10-CM | POA: Diagnosis not present

## 2022-03-18 DIAGNOSIS — M9903 Segmental and somatic dysfunction of lumbar region: Secondary | ICD-10-CM | POA: Diagnosis not present

## 2022-03-20 DIAGNOSIS — Z85828 Personal history of other malignant neoplasm of skin: Secondary | ICD-10-CM | POA: Diagnosis not present

## 2022-03-20 DIAGNOSIS — L570 Actinic keratosis: Secondary | ICD-10-CM | POA: Diagnosis not present

## 2022-04-01 DIAGNOSIS — M9905 Segmental and somatic dysfunction of pelvic region: Secondary | ICD-10-CM | POA: Diagnosis not present

## 2022-04-01 DIAGNOSIS — M9903 Segmental and somatic dysfunction of lumbar region: Secondary | ICD-10-CM | POA: Diagnosis not present

## 2022-04-01 DIAGNOSIS — M62830 Muscle spasm of back: Secondary | ICD-10-CM | POA: Diagnosis not present

## 2022-04-01 DIAGNOSIS — M9902 Segmental and somatic dysfunction of thoracic region: Secondary | ICD-10-CM | POA: Diagnosis not present

## 2022-05-27 DIAGNOSIS — Z6828 Body mass index (BMI) 28.0-28.9, adult: Secondary | ICD-10-CM | POA: Diagnosis not present

## 2022-05-27 DIAGNOSIS — G2581 Restless legs syndrome: Secondary | ICD-10-CM | POA: Diagnosis not present

## 2022-05-27 DIAGNOSIS — F419 Anxiety disorder, unspecified: Secondary | ICD-10-CM | POA: Diagnosis not present

## 2022-05-27 DIAGNOSIS — E785 Hyperlipidemia, unspecified: Secondary | ICD-10-CM | POA: Diagnosis not present

## 2022-05-27 DIAGNOSIS — E559 Vitamin D deficiency, unspecified: Secondary | ICD-10-CM | POA: Diagnosis not present

## 2022-05-27 DIAGNOSIS — Z9181 History of falling: Secondary | ICD-10-CM | POA: Diagnosis not present

## 2022-06-12 DIAGNOSIS — N289 Disorder of kidney and ureter, unspecified: Secondary | ICD-10-CM | POA: Diagnosis not present

## 2022-07-16 DIAGNOSIS — N289 Disorder of kidney and ureter, unspecified: Secondary | ICD-10-CM | POA: Diagnosis not present

## 2022-07-16 DIAGNOSIS — Z6828 Body mass index (BMI) 28.0-28.9, adult: Secondary | ICD-10-CM | POA: Diagnosis not present

## 2022-07-16 DIAGNOSIS — Z8269 Family history of other diseases of the musculoskeletal system and connective tissue: Secondary | ICD-10-CM | POA: Diagnosis not present

## 2022-07-17 DIAGNOSIS — Z1231 Encounter for screening mammogram for malignant neoplasm of breast: Secondary | ICD-10-CM | POA: Diagnosis not present

## 2022-07-28 DIAGNOSIS — H35342 Macular cyst, hole, or pseudohole, left eye: Secondary | ICD-10-CM | POA: Diagnosis not present

## 2022-07-28 DIAGNOSIS — Z961 Presence of intraocular lens: Secondary | ICD-10-CM | POA: Diagnosis not present

## 2022-07-28 DIAGNOSIS — H40053 Ocular hypertension, bilateral: Secondary | ICD-10-CM | POA: Diagnosis not present

## 2022-08-20 DIAGNOSIS — M62830 Muscle spasm of back: Secondary | ICD-10-CM | POA: Diagnosis not present

## 2022-08-20 DIAGNOSIS — M9905 Segmental and somatic dysfunction of pelvic region: Secondary | ICD-10-CM | POA: Diagnosis not present

## 2022-08-20 DIAGNOSIS — M9903 Segmental and somatic dysfunction of lumbar region: Secondary | ICD-10-CM | POA: Diagnosis not present

## 2022-08-20 DIAGNOSIS — M9902 Segmental and somatic dysfunction of thoracic region: Secondary | ICD-10-CM | POA: Diagnosis not present

## 2022-08-25 DIAGNOSIS — M62830 Muscle spasm of back: Secondary | ICD-10-CM | POA: Diagnosis not present

## 2022-08-25 DIAGNOSIS — M9902 Segmental and somatic dysfunction of thoracic region: Secondary | ICD-10-CM | POA: Diagnosis not present

## 2022-08-25 DIAGNOSIS — M9903 Segmental and somatic dysfunction of lumbar region: Secondary | ICD-10-CM | POA: Diagnosis not present

## 2022-08-25 DIAGNOSIS — M9905 Segmental and somatic dysfunction of pelvic region: Secondary | ICD-10-CM | POA: Diagnosis not present

## 2022-09-25 DIAGNOSIS — C44729 Squamous cell carcinoma of skin of left lower limb, including hip: Secondary | ICD-10-CM | POA: Diagnosis not present

## 2022-09-25 DIAGNOSIS — D485 Neoplasm of uncertain behavior of skin: Secondary | ICD-10-CM | POA: Diagnosis not present

## 2022-09-25 DIAGNOSIS — Z85828 Personal history of other malignant neoplasm of skin: Secondary | ICD-10-CM | POA: Diagnosis not present

## 2022-10-21 ENCOUNTER — Ambulatory Visit: Payer: Medicare Other | Admitting: Internal Medicine

## 2022-10-30 DIAGNOSIS — N390 Urinary tract infection, site not specified: Secondary | ICD-10-CM | POA: Diagnosis not present

## 2022-10-30 DIAGNOSIS — R809 Proteinuria, unspecified: Secondary | ICD-10-CM | POA: Diagnosis not present

## 2022-10-30 DIAGNOSIS — N1832 Chronic kidney disease, stage 3b: Secondary | ICD-10-CM | POA: Diagnosis not present

## 2022-10-30 DIAGNOSIS — E559 Vitamin D deficiency, unspecified: Secondary | ICD-10-CM | POA: Diagnosis not present

## 2022-11-04 ENCOUNTER — Other Ambulatory Visit: Payer: Self-pay | Admitting: Nephrology

## 2022-11-04 DIAGNOSIS — R809 Proteinuria, unspecified: Secondary | ICD-10-CM

## 2022-11-04 DIAGNOSIS — N1832 Chronic kidney disease, stage 3b: Secondary | ICD-10-CM

## 2022-11-12 ENCOUNTER — Ambulatory Visit
Admission: RE | Admit: 2022-11-12 | Discharge: 2022-11-12 | Disposition: A | Discharge status: Home / Self Care | Payer: Medicare PPO | Source: Ambulatory Visit | Attending: Nephrology | Admitting: Nephrology

## 2022-11-12 DIAGNOSIS — R809 Proteinuria, unspecified: Secondary | ICD-10-CM | POA: Diagnosis not present

## 2022-11-12 DIAGNOSIS — N1832 Chronic kidney disease, stage 3b: Secondary | ICD-10-CM

## 2022-11-12 DIAGNOSIS — N189 Chronic kidney disease, unspecified: Secondary | ICD-10-CM | POA: Diagnosis not present

## 2022-11-18 DIAGNOSIS — M9902 Segmental and somatic dysfunction of thoracic region: Secondary | ICD-10-CM | POA: Diagnosis not present

## 2022-11-18 DIAGNOSIS — M9905 Segmental and somatic dysfunction of pelvic region: Secondary | ICD-10-CM | POA: Diagnosis not present

## 2022-11-18 DIAGNOSIS — N1832 Chronic kidney disease, stage 3b: Secondary | ICD-10-CM | POA: Diagnosis not present

## 2022-11-18 DIAGNOSIS — M62830 Muscle spasm of back: Secondary | ICD-10-CM | POA: Diagnosis not present

## 2022-11-18 DIAGNOSIS — M9903 Segmental and somatic dysfunction of lumbar region: Secondary | ICD-10-CM | POA: Diagnosis not present

## 2022-11-27 DIAGNOSIS — Z85828 Personal history of other malignant neoplasm of skin: Secondary | ICD-10-CM | POA: Diagnosis not present

## 2022-11-27 DIAGNOSIS — L821 Other seborrheic keratosis: Secondary | ICD-10-CM | POA: Diagnosis not present

## 2022-11-27 DIAGNOSIS — D2272 Melanocytic nevi of left lower limb, including hip: Secondary | ICD-10-CM | POA: Diagnosis not present

## 2022-11-27 DIAGNOSIS — D225 Melanocytic nevi of trunk: Secondary | ICD-10-CM | POA: Diagnosis not present

## 2022-11-27 DIAGNOSIS — D485 Neoplasm of uncertain behavior of skin: Secondary | ICD-10-CM | POA: Diagnosis not present

## 2022-11-27 DIAGNOSIS — D1801 Hemangioma of skin and subcutaneous tissue: Secondary | ICD-10-CM | POA: Diagnosis not present

## 2022-11-27 DIAGNOSIS — L570 Actinic keratosis: Secondary | ICD-10-CM | POA: Diagnosis not present

## 2022-11-27 DIAGNOSIS — L814 Other melanin hyperpigmentation: Secondary | ICD-10-CM | POA: Diagnosis not present

## 2022-12-02 DIAGNOSIS — G2581 Restless legs syndrome: Secondary | ICD-10-CM | POA: Diagnosis not present

## 2022-12-02 DIAGNOSIS — F32A Depression, unspecified: Secondary | ICD-10-CM | POA: Diagnosis not present

## 2022-12-02 DIAGNOSIS — E785 Hyperlipidemia, unspecified: Secondary | ICD-10-CM | POA: Diagnosis not present

## 2022-12-02 DIAGNOSIS — Z139 Encounter for screening, unspecified: Secondary | ICD-10-CM | POA: Diagnosis not present

## 2022-12-02 DIAGNOSIS — Z6829 Body mass index (BMI) 29.0-29.9, adult: Secondary | ICD-10-CM | POA: Diagnosis not present

## 2022-12-02 DIAGNOSIS — N1832 Chronic kidney disease, stage 3b: Secondary | ICD-10-CM | POA: Diagnosis not present

## 2023-01-01 DIAGNOSIS — M62830 Muscle spasm of back: Secondary | ICD-10-CM | POA: Diagnosis not present

## 2023-01-01 DIAGNOSIS — M9902 Segmental and somatic dysfunction of thoracic region: Secondary | ICD-10-CM | POA: Diagnosis not present

## 2023-01-01 DIAGNOSIS — M9905 Segmental and somatic dysfunction of pelvic region: Secondary | ICD-10-CM | POA: Diagnosis not present

## 2023-01-01 DIAGNOSIS — M9903 Segmental and somatic dysfunction of lumbar region: Secondary | ICD-10-CM | POA: Diagnosis not present

## 2023-01-27 DIAGNOSIS — M5136 Other intervertebral disc degeneration, lumbar region: Secondary | ICD-10-CM | POA: Diagnosis not present

## 2023-01-28 DIAGNOSIS — Z961 Presence of intraocular lens: Secondary | ICD-10-CM | POA: Diagnosis not present

## 2023-01-28 DIAGNOSIS — H401131 Primary open-angle glaucoma, bilateral, mild stage: Secondary | ICD-10-CM | POA: Diagnosis not present

## 2023-01-28 DIAGNOSIS — H35342 Macular cyst, hole, or pseudohole, left eye: Secondary | ICD-10-CM | POA: Diagnosis not present

## 2023-01-31 DIAGNOSIS — M5416 Radiculopathy, lumbar region: Secondary | ICD-10-CM | POA: Diagnosis not present

## 2023-02-04 DIAGNOSIS — M5136 Other intervertebral disc degeneration, lumbar region: Secondary | ICD-10-CM | POA: Diagnosis not present

## 2023-02-23 DIAGNOSIS — M5416 Radiculopathy, lumbar region: Secondary | ICD-10-CM | POA: Diagnosis not present

## 2023-04-06 DIAGNOSIS — H25041 Posterior subcapsular polar age-related cataract, right eye: Secondary | ICD-10-CM | POA: Diagnosis not present

## 2023-04-06 DIAGNOSIS — H18413 Arcus senilis, bilateral: Secondary | ICD-10-CM | POA: Diagnosis not present

## 2023-04-06 DIAGNOSIS — H2511 Age-related nuclear cataract, right eye: Secondary | ICD-10-CM | POA: Diagnosis not present

## 2023-04-06 DIAGNOSIS — H40013 Open angle with borderline findings, low risk, bilateral: Secondary | ICD-10-CM | POA: Diagnosis not present

## 2023-04-06 DIAGNOSIS — H25011 Cortical age-related cataract, right eye: Secondary | ICD-10-CM | POA: Diagnosis not present

## 2023-04-07 DIAGNOSIS — I129 Hypertensive chronic kidney disease with stage 1 through stage 4 chronic kidney disease, or unspecified chronic kidney disease: Secondary | ICD-10-CM | POA: Diagnosis not present

## 2023-04-07 DIAGNOSIS — N1832 Chronic kidney disease, stage 3b: Secondary | ICD-10-CM | POA: Diagnosis not present

## 2023-04-07 DIAGNOSIS — E785 Hyperlipidemia, unspecified: Secondary | ICD-10-CM | POA: Diagnosis not present

## 2023-04-07 DIAGNOSIS — R809 Proteinuria, unspecified: Secondary | ICD-10-CM | POA: Diagnosis not present

## 2023-04-07 DIAGNOSIS — E559 Vitamin D deficiency, unspecified: Secondary | ICD-10-CM | POA: Diagnosis not present

## 2023-05-20 DIAGNOSIS — E039 Hypothyroidism, unspecified: Secondary | ICD-10-CM | POA: Diagnosis not present

## 2023-05-20 DIAGNOSIS — M810 Age-related osteoporosis without current pathological fracture: Secondary | ICD-10-CM | POA: Diagnosis not present

## 2023-05-20 DIAGNOSIS — Z79890 Hormone replacement therapy (postmenopausal): Secondary | ICD-10-CM | POA: Diagnosis not present

## 2023-05-20 DIAGNOSIS — N951 Menopausal and female climacteric states: Secondary | ICD-10-CM | POA: Diagnosis not present

## 2023-05-20 DIAGNOSIS — E559 Vitamin D deficiency, unspecified: Secondary | ICD-10-CM | POA: Diagnosis not present

## 2023-05-20 DIAGNOSIS — E079 Disorder of thyroid, unspecified: Secondary | ICD-10-CM | POA: Diagnosis not present

## 2023-05-31 DIAGNOSIS — N390 Urinary tract infection, site not specified: Secondary | ICD-10-CM | POA: Diagnosis not present

## 2023-05-31 DIAGNOSIS — R300 Dysuria: Secondary | ICD-10-CM | POA: Diagnosis not present

## 2023-06-30 DIAGNOSIS — H2511 Age-related nuclear cataract, right eye: Secondary | ICD-10-CM | POA: Diagnosis not present

## 2023-06-30 DIAGNOSIS — H269 Unspecified cataract: Secondary | ICD-10-CM | POA: Diagnosis not present

## 2023-07-02 DIAGNOSIS — Z803 Family history of malignant neoplasm of breast: Secondary | ICD-10-CM | POA: Diagnosis not present

## 2023-07-02 DIAGNOSIS — Z139 Encounter for screening, unspecified: Secondary | ICD-10-CM | POA: Diagnosis not present

## 2023-07-02 DIAGNOSIS — Z129 Encounter for screening for malignant neoplasm, site unspecified: Secondary | ICD-10-CM | POA: Diagnosis not present

## 2023-07-02 DIAGNOSIS — Z1239 Encounter for other screening for malignant neoplasm of breast: Secondary | ICD-10-CM | POA: Diagnosis not present

## 2023-07-02 DIAGNOSIS — Z853 Personal history of malignant neoplasm of breast: Secondary | ICD-10-CM | POA: Diagnosis not present

## 2023-07-03 DIAGNOSIS — N83209 Unspecified ovarian cyst, unspecified side: Secondary | ICD-10-CM | POA: Diagnosis not present

## 2023-07-03 DIAGNOSIS — Z90710 Acquired absence of both cervix and uterus: Secondary | ICD-10-CM | POA: Diagnosis not present

## 2023-07-03 DIAGNOSIS — E282 Polycystic ovarian syndrome: Secondary | ICD-10-CM | POA: Diagnosis not present

## 2023-07-07 DIAGNOSIS — G2581 Restless legs syndrome: Secondary | ICD-10-CM | POA: Diagnosis not present

## 2023-07-07 DIAGNOSIS — Z6828 Body mass index (BMI) 28.0-28.9, adult: Secondary | ICD-10-CM | POA: Diagnosis not present

## 2023-07-07 DIAGNOSIS — E785 Hyperlipidemia, unspecified: Secondary | ICD-10-CM | POA: Diagnosis not present

## 2023-07-07 DIAGNOSIS — F419 Anxiety disorder, unspecified: Secondary | ICD-10-CM | POA: Diagnosis not present

## 2023-07-07 DIAGNOSIS — E559 Vitamin D deficiency, unspecified: Secondary | ICD-10-CM | POA: Diagnosis not present

## 2023-07-07 DIAGNOSIS — Z9181 History of falling: Secondary | ICD-10-CM | POA: Diagnosis not present

## 2023-07-10 DIAGNOSIS — N390 Urinary tract infection, site not specified: Secondary | ICD-10-CM | POA: Diagnosis not present

## 2023-07-10 DIAGNOSIS — R300 Dysuria: Secondary | ICD-10-CM | POA: Diagnosis not present

## 2023-07-28 DIAGNOSIS — R300 Dysuria: Secondary | ICD-10-CM | POA: Diagnosis not present

## 2023-07-28 DIAGNOSIS — N390 Urinary tract infection, site not specified: Secondary | ICD-10-CM | POA: Diagnosis not present

## 2023-08-10 DIAGNOSIS — H20021 Recurrent acute iridocyclitis, right eye: Secondary | ICD-10-CM | POA: Diagnosis not present

## 2023-08-10 DIAGNOSIS — H04201 Unspecified epiphora, right lacrimal gland: Secondary | ICD-10-CM | POA: Diagnosis not present

## 2023-08-31 DIAGNOSIS — H1031 Unspecified acute conjunctivitis, right eye: Secondary | ICD-10-CM | POA: Diagnosis not present

## 2023-09-16 DIAGNOSIS — Z79899 Other long term (current) drug therapy: Secondary | ICD-10-CM | POA: Diagnosis not present

## 2023-09-16 DIAGNOSIS — N952 Postmenopausal atrophic vaginitis: Secondary | ICD-10-CM | POA: Diagnosis not present

## 2023-09-16 DIAGNOSIS — N390 Urinary tract infection, site not specified: Secondary | ICD-10-CM | POA: Diagnosis not present

## 2023-09-24 DIAGNOSIS — N951 Menopausal and female climacteric states: Secondary | ICD-10-CM | POA: Diagnosis not present

## 2023-09-24 DIAGNOSIS — Z79890 Hormone replacement therapy (postmenopausal): Secondary | ICD-10-CM | POA: Diagnosis not present

## 2023-09-28 DIAGNOSIS — N1832 Chronic kidney disease, stage 3b: Secondary | ICD-10-CM | POA: Diagnosis not present

## 2023-10-20 DIAGNOSIS — E785 Hyperlipidemia, unspecified: Secondary | ICD-10-CM | POA: Diagnosis not present

## 2023-10-20 DIAGNOSIS — E559 Vitamin D deficiency, unspecified: Secondary | ICD-10-CM | POA: Diagnosis not present

## 2023-10-20 DIAGNOSIS — R809 Proteinuria, unspecified: Secondary | ICD-10-CM | POA: Diagnosis not present

## 2023-10-20 DIAGNOSIS — I129 Hypertensive chronic kidney disease with stage 1 through stage 4 chronic kidney disease, or unspecified chronic kidney disease: Secondary | ICD-10-CM | POA: Diagnosis not present

## 2023-10-20 DIAGNOSIS — N1832 Chronic kidney disease, stage 3b: Secondary | ICD-10-CM | POA: Diagnosis not present

## 2023-10-22 DIAGNOSIS — M9905 Segmental and somatic dysfunction of pelvic region: Secondary | ICD-10-CM | POA: Diagnosis not present

## 2023-10-22 DIAGNOSIS — M62830 Muscle spasm of back: Secondary | ICD-10-CM | POA: Diagnosis not present

## 2023-10-22 DIAGNOSIS — M9903 Segmental and somatic dysfunction of lumbar region: Secondary | ICD-10-CM | POA: Diagnosis not present

## 2023-10-22 DIAGNOSIS — M9902 Segmental and somatic dysfunction of thoracic region: Secondary | ICD-10-CM | POA: Diagnosis not present

## 2023-10-26 DIAGNOSIS — M62830 Muscle spasm of back: Secondary | ICD-10-CM | POA: Diagnosis not present

## 2023-10-26 DIAGNOSIS — M9902 Segmental and somatic dysfunction of thoracic region: Secondary | ICD-10-CM | POA: Diagnosis not present

## 2023-10-26 DIAGNOSIS — M9903 Segmental and somatic dysfunction of lumbar region: Secondary | ICD-10-CM | POA: Diagnosis not present

## 2023-10-26 DIAGNOSIS — M9905 Segmental and somatic dysfunction of pelvic region: Secondary | ICD-10-CM | POA: Diagnosis not present

## 2023-11-02 DIAGNOSIS — M62830 Muscle spasm of back: Secondary | ICD-10-CM | POA: Diagnosis not present

## 2023-11-02 DIAGNOSIS — M9903 Segmental and somatic dysfunction of lumbar region: Secondary | ICD-10-CM | POA: Diagnosis not present

## 2023-11-02 DIAGNOSIS — M9905 Segmental and somatic dysfunction of pelvic region: Secondary | ICD-10-CM | POA: Diagnosis not present

## 2023-11-02 DIAGNOSIS — M9902 Segmental and somatic dysfunction of thoracic region: Secondary | ICD-10-CM | POA: Diagnosis not present

## 2023-11-10 DIAGNOSIS — M9905 Segmental and somatic dysfunction of pelvic region: Secondary | ICD-10-CM | POA: Diagnosis not present

## 2023-11-10 DIAGNOSIS — M9902 Segmental and somatic dysfunction of thoracic region: Secondary | ICD-10-CM | POA: Diagnosis not present

## 2023-11-10 DIAGNOSIS — M62830 Muscle spasm of back: Secondary | ICD-10-CM | POA: Diagnosis not present

## 2023-11-10 DIAGNOSIS — M9903 Segmental and somatic dysfunction of lumbar region: Secondary | ICD-10-CM | POA: Diagnosis not present

## 2023-11-27 DIAGNOSIS — Z79890 Hormone replacement therapy (postmenopausal): Secondary | ICD-10-CM | POA: Diagnosis not present

## 2023-11-27 DIAGNOSIS — N951 Menopausal and female climacteric states: Secondary | ICD-10-CM | POA: Diagnosis not present

## 2023-11-30 DIAGNOSIS — L821 Other seborrheic keratosis: Secondary | ICD-10-CM | POA: Diagnosis not present

## 2023-11-30 DIAGNOSIS — Z85828 Personal history of other malignant neoplasm of skin: Secondary | ICD-10-CM | POA: Diagnosis not present

## 2023-11-30 DIAGNOSIS — D0471 Carcinoma in situ of skin of right lower limb, including hip: Secondary | ICD-10-CM | POA: Diagnosis not present

## 2023-11-30 DIAGNOSIS — L814 Other melanin hyperpigmentation: Secondary | ICD-10-CM | POA: Diagnosis not present

## 2023-11-30 DIAGNOSIS — L570 Actinic keratosis: Secondary | ICD-10-CM | POA: Diagnosis not present

## 2023-11-30 DIAGNOSIS — D225 Melanocytic nevi of trunk: Secondary | ICD-10-CM | POA: Diagnosis not present

## 2023-11-30 DIAGNOSIS — C44722 Squamous cell carcinoma of skin of right lower limb, including hip: Secondary | ICD-10-CM | POA: Diagnosis not present

## 2023-11-30 DIAGNOSIS — D485 Neoplasm of uncertain behavior of skin: Secondary | ICD-10-CM | POA: Diagnosis not present

## 2024-01-07 DIAGNOSIS — G2581 Restless legs syndrome: Secondary | ICD-10-CM | POA: Diagnosis not present

## 2024-01-07 DIAGNOSIS — E559 Vitamin D deficiency, unspecified: Secondary | ICD-10-CM | POA: Diagnosis not present

## 2024-01-07 DIAGNOSIS — E785 Hyperlipidemia, unspecified: Secondary | ICD-10-CM | POA: Diagnosis not present

## 2024-01-07 DIAGNOSIS — F419 Anxiety disorder, unspecified: Secondary | ICD-10-CM | POA: Diagnosis not present

## 2024-01-19 DIAGNOSIS — N951 Menopausal and female climacteric states: Secondary | ICD-10-CM | POA: Diagnosis not present

## 2024-01-19 DIAGNOSIS — Z79890 Hormone replacement therapy (postmenopausal): Secondary | ICD-10-CM | POA: Diagnosis not present

## 2024-01-27 DIAGNOSIS — H401131 Primary open-angle glaucoma, bilateral, mild stage: Secondary | ICD-10-CM | POA: Diagnosis not present

## 2024-02-16 DIAGNOSIS — M9905 Segmental and somatic dysfunction of pelvic region: Secondary | ICD-10-CM | POA: Diagnosis not present

## 2024-02-16 DIAGNOSIS — M9903 Segmental and somatic dysfunction of lumbar region: Secondary | ICD-10-CM | POA: Diagnosis not present

## 2024-02-16 DIAGNOSIS — M9902 Segmental and somatic dysfunction of thoracic region: Secondary | ICD-10-CM | POA: Diagnosis not present

## 2024-02-16 DIAGNOSIS — M62830 Muscle spasm of back: Secondary | ICD-10-CM | POA: Diagnosis not present

## 2024-02-22 DIAGNOSIS — M9905 Segmental and somatic dysfunction of pelvic region: Secondary | ICD-10-CM | POA: Diagnosis not present

## 2024-02-22 DIAGNOSIS — M9903 Segmental and somatic dysfunction of lumbar region: Secondary | ICD-10-CM | POA: Diagnosis not present

## 2024-02-22 DIAGNOSIS — M9902 Segmental and somatic dysfunction of thoracic region: Secondary | ICD-10-CM | POA: Diagnosis not present

## 2024-02-22 DIAGNOSIS — M62830 Muscle spasm of back: Secondary | ICD-10-CM | POA: Diagnosis not present

## 2024-02-23 ENCOUNTER — Institutional Professional Consult (permissible substitution) (HOSPITAL_BASED_OUTPATIENT_CLINIC_OR_DEPARTMENT_OTHER): Payer: Medicare PPO | Admitting: Internal Medicine

## 2024-04-01 DIAGNOSIS — M9905 Segmental and somatic dysfunction of pelvic region: Secondary | ICD-10-CM | POA: Diagnosis not present

## 2024-04-01 DIAGNOSIS — M62830 Muscle spasm of back: Secondary | ICD-10-CM | POA: Diagnosis not present

## 2024-04-01 DIAGNOSIS — M9902 Segmental and somatic dysfunction of thoracic region: Secondary | ICD-10-CM | POA: Diagnosis not present

## 2024-04-01 DIAGNOSIS — M9903 Segmental and somatic dysfunction of lumbar region: Secondary | ICD-10-CM | POA: Diagnosis not present

## 2024-04-04 DIAGNOSIS — Z79890 Hormone replacement therapy (postmenopausal): Secondary | ICD-10-CM | POA: Diagnosis not present

## 2024-04-04 DIAGNOSIS — E063 Autoimmune thyroiditis: Secondary | ICD-10-CM | POA: Diagnosis not present

## 2024-04-04 DIAGNOSIS — N1832 Chronic kidney disease, stage 3b: Secondary | ICD-10-CM | POA: Diagnosis not present

## 2024-04-04 DIAGNOSIS — Z8249 Family history of ischemic heart disease and other diseases of the circulatory system: Secondary | ICD-10-CM | POA: Diagnosis not present

## 2024-04-04 DIAGNOSIS — Z0001 Encounter for general adult medical examination with abnormal findings: Secondary | ICD-10-CM | POA: Diagnosis not present

## 2024-04-20 DIAGNOSIS — E785 Hyperlipidemia, unspecified: Secondary | ICD-10-CM | POA: Diagnosis not present

## 2024-04-20 DIAGNOSIS — N1832 Chronic kidney disease, stage 3b: Secondary | ICD-10-CM | POA: Diagnosis not present

## 2024-04-20 DIAGNOSIS — I129 Hypertensive chronic kidney disease with stage 1 through stage 4 chronic kidney disease, or unspecified chronic kidney disease: Secondary | ICD-10-CM | POA: Diagnosis not present

## 2024-04-20 DIAGNOSIS — R809 Proteinuria, unspecified: Secondary | ICD-10-CM | POA: Diagnosis not present

## 2024-04-29 DIAGNOSIS — R300 Dysuria: Secondary | ICD-10-CM | POA: Diagnosis not present

## 2024-05-03 NOTE — Progress Notes (Signed)
 CARDIOLOGY CONSULT NOTE       Patient ID: Margaret Frank MRN: [id number] DOB/AGE: [birth year] 75 y.o.  Admit date: (Not on file) Referring Physician: Madalyn Scarce NP Primary Physician: Adrian Hopper, MD Primary Cardiologist: New Reason for Consultation: HLD   HPI:  73 y.o. with GAD, Depression, HLD and restless leg syndrome. She sees a holistic provider and started on progesterone  for elevated testosterone  and menopause symptoms. Father had HLD, HTN and stroke. Mother had Lupus. She is widowed with two older children Retired Runner, broadcasting/film/video She does not want to take statins and deferred zetia so primary sent her to cardiology Her LDL is 197 HDL 54 TC 317 and triglycerides 332  Other labs include Cr 1.1 BUN 23 K 4.3 Normal LFTls Hct 48.2 on hormone Rx  She is the widow of one of my favorite patients Margaret Frank who passed away years ago 06-10-2009 and use to be the [employer].   She has no clinical symptoms She is active helping to raise her daughters 3 kids in [place]. She is dating Margaret Frank currently   ROS All other systems reviewed and negative except as noted above  Past Medical History:  Diagnosis Date   Anxiety    Depression    HLD (hyperlipidemia)    Hyperlipidemia    Hypertension    RLS (restless legs syndrome)    Vitamin D deficiency     Family History  Problem Relation Age of Onset   Pancreatic cancer Mother    Hypertension Father    Dementia Father     Social History   Socioeconomic History   Marital status: Widowed    Spouse name: Not on file   Number of children: Not on file   Years of education: Not on file   Highest education level: Not on file  Occupational History   Not on file  Tobacco Use   Smoking status: Never   Smokeless tobacco: Never  Substance and Sexual Activity   Alcohol use: No    Alcohol/week: 0.0 standard drinks of alcohol   Drug use: No   Sexual activity: Not on file  Other Topics Concern   Not on file  Social History  Narrative   Not on file   Social Drivers of Health   Financial Resource Strain: Not on file  Food Insecurity: Not on file  Transportation Needs: Not on file  Physical Activity: Not on file  Stress: Not on file  Social Connections: Not on file  Intimate Partner Violence: Not on file    Past Surgical History:  Procedure Laterality Date   ABDOMINAL HYSTERECTOMY     ADENOIDECTOMY     ANKLE SURGERY Right    APPENDECTOMY     CHOLECYSTECTOMY        Current Outpatient Medications:    buPROPion  (WELLBUTRIN  XL) 150 MG 24 hr tablet, Take 3 tablets (450 mg total) by mouth every morning., Disp: 270 tablet, Rfl: 0   hydrochlorothiazide (HYDRODIURIL) 25 MG tablet, Take 25 mg by mouth daily., Disp: , Rfl:    losartan (COZAAR) 25 MG tablet, Take 25 mg by mouth daily., Disp: , Rfl:    PARoxetine (PAXIL) 40 MG tablet, Take 1 tablet by mouth daily., Disp: , Rfl:    Potassium Chloride ER 20 MEQ TBCR, Take 1 tablet by mouth daily., Disp: , Rfl:    progesterone  (PROMETRIUM ) 100 MG capsule, SMARTSIG:3 Capsule(s) By Mouth Every Evening, Disp: , Rfl:    Rhodiola 300 MG CAPS, Take 1 capsule  by mouth daily., Disp: , Rfl:    timolol (TIMOPTIC) 0.5 % ophthalmic solution, 1 drop every morning., Disp: , Rfl:    tiZANidine  (ZANAFLEX ) 2 MG tablet, Take 1 tablet (2 mg total) by mouth at bedtime as needed for muscle spasms., Disp: 10 tablet, Rfl: 0    Physical Exam: Blood pressure 139/80, pulse 79, height 5' 8.5 (1.74 m), weight 186 lb (84.4 kg), SpO2 94%.    Affect appropriate Healthy:  appears stated age HEENT: normal Neck supple with no adenopathy JVP normal no bruits no thyromegaly Lungs clear with no wheezing and good diaphragmatic motion Heart:  S1/S2 no murmur, no rub, gallop or click PMI normal Abdomen: benighn, BS positve, no tenderness, no AAA no bruit.  No HSM or HJR Distal pulses intact with no bruits No edema Neuro non-focal Skin warm and dry No muscular weakness    Radiology: No  results found.  EKG: NSR LBBB   ASSESSMENT AND PLAN:   HLD:  check vascular bundle and calcium score see if we can get objective data to persuade her to Rx Depression:  continue Wellbutrin   HTN:  on low dose  losartan  Holistic:  on progesterone  for elevated testosterone  and menapause symptoms monitor Hct LBBB: on ECG consider f/u echo in future but currently no syncope, palpitations, dyspnea or chest pain   Calcium score Vascular Bundle   F/U with Dr Maximo Spar for lipids   Signed: Janelle Mediate 05/13/2024, 3:48 PM

## 2024-05-13 ENCOUNTER — Encounter: Payer: Self-pay | Admitting: Cardiovascular Disease

## 2024-05-13 ENCOUNTER — Ambulatory Visit: Payer: Medicare PPO | Attending: Cardiovascular Disease | Admitting: Cardiovascular Disease

## 2024-05-13 VITALS — BP 139/80 | HR 79 | Ht 68.5 in | Wt 186.0 lb

## 2024-05-13 DIAGNOSIS — I447 Left bundle-branch block, unspecified: Secondary | ICD-10-CM | POA: Diagnosis not present

## 2024-05-13 DIAGNOSIS — E785 Hyperlipidemia, unspecified: Secondary | ICD-10-CM | POA: Diagnosis not present

## 2024-05-13 DIAGNOSIS — I10 Essential (primary) hypertension: Secondary | ICD-10-CM

## 2024-05-13 NOTE — Patient Instructions (Addendum)
 Medication Instructions:  Your physician recommends that you continue on your current medications as directed. Please refer to the Current Medication list given to you today.  *If you need a refill on your cardiac medications before your next appointment, please call your pharmacy*  Lab Work: If you have labs (blood work) drawn today and your tests are completely normal, you will receive your results only by: MyChart Message (if you have MyChart) OR A paper copy in the mail If you have any lab test that is abnormal or we need to change your treatment, we will call you to review the results.  Testing/Procedures: CT scanning for a cardiac calcium score (CAT scanning), is a noninvasive, special x-ray that produces cross-sectional images of the body using x-rays and a computer. CT scans help physicians diagnose and treat medical conditions. For some CT exams, a contrast material is used to enhance visibility in the area of the body being studied. CT scans provide greater clarity and reveal more details than regular x-ray exams.   Your physician has requested that you have vascular screening, for all three.   Please note: We ask at that you not bring children with you during ultrasound (echo/ vascular) testing. Due to room size and safety concerns, children are not allowed in the ultrasound rooms during exams. Our front office staff cannot provide observation of children in our lobby area while testing is being conducted. An adult accompanying a patient to their appointment will only be allowed in the ultrasound room at the discretion of the ultrasound technician under special circumstances. We apologize for any inconvenience.  Follow-Up: At [facility], you and your health needs are our priority.  As part of our continuing mission to provide you with exceptional heart care, our providers are all part of one team.  This team includes your primary Cardiologist (physician) and Advanced Practice  Providers or APPs (Physician Assistants and Nurse Practitioners) who all work together to provide you with the care you need, when you need it.  Your next appointment:   As needed  Provider:   Janelle Mediate, MD    We recommend signing up for the patient portal called MyChart.  Sign up information is provided on this After Visit Summary.  MyChart is used to connect with patients for Virtual Visits (Telemedicine).  Patients are able to view lab/test results, encounter notes, upcoming appointments, etc.  Non-urgent messages can be sent to your provider as well.   To learn more about what you can do with MyChart, go to [email or web address].

## 2024-06-06 ENCOUNTER — Ambulatory Visit (HOSPITAL_BASED_OUTPATIENT_CLINIC_OR_DEPARTMENT_OTHER)
Admission: RE | Admit: 2024-06-06 | Discharge: 2024-06-06 | Disposition: A | Discharge status: Home / Self Care | Payer: Self-pay | Source: Ambulatory Visit | Attending: Cardiovascular Disease | Admitting: Cardiovascular Disease

## 2024-06-06 DIAGNOSIS — I10 Essential (primary) hypertension: Secondary | ICD-10-CM

## 2024-06-06 DIAGNOSIS — I2510 Atherosclerotic heart disease of native coronary artery without angina pectoris: Secondary | ICD-10-CM | POA: Diagnosis not present

## 2024-06-08 ENCOUNTER — Ambulatory Visit (HOSPITAL_COMMUNITY)
Admission: RE | Admit: 2024-06-08 | Discharge: 2024-06-08 | Disposition: A | Discharge status: Home / Self Care | Payer: Self-pay | Source: Ambulatory Visit | Attending: Cardiovascular Disease | Admitting: Cardiovascular Disease

## 2024-06-08 DIAGNOSIS — I10 Essential (primary) hypertension: Secondary | ICD-10-CM

## 2024-06-10 ENCOUNTER — Ambulatory Visit: Payer: Self-pay | Admitting: Cardiovascular Disease

## 2024-06-13 DIAGNOSIS — D485 Neoplasm of uncertain behavior of skin: Secondary | ICD-10-CM | POA: Diagnosis not present

## 2024-06-13 DIAGNOSIS — Z85828 Personal history of other malignant neoplasm of skin: Secondary | ICD-10-CM | POA: Diagnosis not present

## 2024-06-13 DIAGNOSIS — L570 Actinic keratosis: Secondary | ICD-10-CM | POA: Diagnosis not present

## 2024-06-13 DIAGNOSIS — D0462 Carcinoma in situ of skin of left upper limb, including shoulder: Secondary | ICD-10-CM | POA: Diagnosis not present

## 2024-06-14 ENCOUNTER — Encounter (HOSPITAL_COMMUNITY): Payer: Self-pay | Admitting: Cardiology

## 2024-07-06 DIAGNOSIS — F419 Anxiety disorder, unspecified: Secondary | ICD-10-CM | POA: Diagnosis not present

## 2024-07-06 DIAGNOSIS — G2581 Restless legs syndrome: Secondary | ICD-10-CM | POA: Diagnosis not present

## 2024-07-06 DIAGNOSIS — E559 Vitamin D deficiency, unspecified: Secondary | ICD-10-CM | POA: Diagnosis not present

## 2024-07-06 DIAGNOSIS — E782 Mixed hyperlipidemia: Secondary | ICD-10-CM | POA: Diagnosis not present

## 2024-07-06 DIAGNOSIS — F32A Depression, unspecified: Secondary | ICD-10-CM | POA: Diagnosis not present

## 2024-07-06 DIAGNOSIS — Z6828 Body mass index (BMI) 28.0-28.9, adult: Secondary | ICD-10-CM | POA: Diagnosis not present

## 2024-07-06 DIAGNOSIS — Z780 Asymptomatic menopausal state: Secondary | ICD-10-CM | POA: Diagnosis not present

## 2024-09-01 DIAGNOSIS — Z79890 Hormone replacement therapy (postmenopausal): Secondary | ICD-10-CM | POA: Diagnosis not present

## 2024-09-01 DIAGNOSIS — Z0001 Encounter for general adult medical examination with abnormal findings: Secondary | ICD-10-CM | POA: Diagnosis not present

## 2024-09-07 DIAGNOSIS — H524 Presbyopia: Secondary | ICD-10-CM | POA: Diagnosis not present

## 2024-09-07 DIAGNOSIS — H401131 Primary open-angle glaucoma, bilateral, mild stage: Secondary | ICD-10-CM | POA: Diagnosis not present

## 2024-09-07 DIAGNOSIS — H35342 Macular cyst, hole, or pseudohole, left eye: Secondary | ICD-10-CM | POA: Diagnosis not present

## 2024-09-07 DIAGNOSIS — Z961 Presence of intraocular lens: Secondary | ICD-10-CM | POA: Diagnosis not present

## 2024-09-07 DIAGNOSIS — H5213 Myopia, bilateral: Secondary | ICD-10-CM | POA: Diagnosis not present

## 2024-10-06 DIAGNOSIS — Z79890 Hormone replacement therapy (postmenopausal): Secondary | ICD-10-CM | POA: Diagnosis not present

## 2024-10-06 DIAGNOSIS — Z0001 Encounter for general adult medical examination with abnormal findings: Secondary | ICD-10-CM | POA: Diagnosis not present

## 2024-10-07 DIAGNOSIS — M9902 Segmental and somatic dysfunction of thoracic region: Secondary | ICD-10-CM | POA: Diagnosis not present

## 2024-10-07 DIAGNOSIS — M9903 Segmental and somatic dysfunction of lumbar region: Secondary | ICD-10-CM | POA: Diagnosis not present

## 2024-10-07 DIAGNOSIS — M9905 Segmental and somatic dysfunction of pelvic region: Secondary | ICD-10-CM | POA: Diagnosis not present

## 2024-10-07 DIAGNOSIS — M9901 Segmental and somatic dysfunction of cervical region: Secondary | ICD-10-CM | POA: Diagnosis not present
